# Patient Record
Sex: Female | Born: 1995 | Race: Black or African American | Hispanic: No | Marital: Single | Smoking: Current every day smoker
Health system: Southern US, Community
[De-identification: ages and names within clinical notes are randomized; demographics above are authoritative.]

## PROBLEM LIST (undated history)

## (undated) DIAGNOSIS — I739 Peripheral vascular disease, unspecified: Secondary | ICD-10-CM

## (undated) DIAGNOSIS — T7840XA Allergy, unspecified, initial encounter: Secondary | ICD-10-CM

## (undated) DIAGNOSIS — L309 Dermatitis, unspecified: Secondary | ICD-10-CM

## (undated) DIAGNOSIS — J45909 Unspecified asthma, uncomplicated: Secondary | ICD-10-CM

## (undated) HISTORY — DX: Allergy, unspecified, initial encounter: T78.40XA

## (undated) HISTORY — PX: HERNIA REPAIR: SHX51

## (undated) HISTORY — DX: Peripheral vascular disease, unspecified: I73.9

---
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
## 2015-07-15 ENCOUNTER — Emergency Department (HOSPITAL_BASED_OUTPATIENT_CLINIC_OR_DEPARTMENT_OTHER): Payer: BLUE CROSS/BLUE SHIELD

## 2015-07-15 ENCOUNTER — Emergency Department (HOSPITAL_BASED_OUTPATIENT_CLINIC_OR_DEPARTMENT_OTHER)
Admission: EM | Admit: 2015-07-15 | Discharge: 2015-07-15 | Disposition: A | Discharge status: Home / Self Care | Payer: BLUE CROSS/BLUE SHIELD | Attending: Emergency Medicine | Admitting: Emergency Medicine

## 2015-07-15 ENCOUNTER — Encounter (HOSPITAL_BASED_OUTPATIENT_CLINIC_OR_DEPARTMENT_OTHER): Payer: Self-pay | Admitting: *Deleted

## 2015-07-15 DIAGNOSIS — Z791 Long term (current) use of non-steroidal anti-inflammatories (NSAID): Secondary | ICD-10-CM | POA: Diagnosis not present

## 2015-07-15 DIAGNOSIS — Z792 Long term (current) use of antibiotics: Secondary | ICD-10-CM | POA: Diagnosis not present

## 2015-07-15 DIAGNOSIS — M25511 Pain in right shoulder: Secondary | ICD-10-CM | POA: Insufficient documentation

## 2015-07-15 DIAGNOSIS — Z79899 Other long term (current) drug therapy: Secondary | ICD-10-CM | POA: Diagnosis not present

## 2015-07-15 DIAGNOSIS — J45909 Unspecified asthma, uncomplicated: Secondary | ICD-10-CM | POA: Diagnosis not present

## 2015-07-15 DIAGNOSIS — F17200 Nicotine dependence, unspecified, uncomplicated: Secondary | ICD-10-CM | POA: Insufficient documentation

## 2015-07-15 DIAGNOSIS — E663 Overweight: Secondary | ICD-10-CM | POA: Insufficient documentation

## 2015-07-15 MED ORDER — KETOROLAC TROMETHAMINE 30 MG/ML IJ SOLN
30.0000 mg | Freq: Once | INTRAMUSCULAR | Status: AC
  Administered 2015-07-15: 30 mg via INTRAMUSCULAR
  Filled 2015-07-15: qty 1

## 2015-07-15 MED ORDER — IBUPROFEN 600 MG PO TABS: 600.0000 mg | ORAL_TABLET | Freq: Four times a day (QID) | ORAL | Status: AC | PRN

## 2015-07-15 NOTE — ED Notes (Signed)
DC instructions reviewed with pt along with Rx from EDP as well, discussed ice and rest of rt shoulder to aid in pain control. Opportunity for questions provided

## 2015-07-15 NOTE — ED Notes (Addendum)
C/o R shoulder pain, constant with movement, onset 2d ago, no radiation, (denies: neck, back, arm or hand pain, nvd, fever, sob, cough or other sx), pt is R hand dominant, works doing heavy lifting packing road signs, no h/o same, rates 7/10, took ASA 2d ago, no meds PTA, pt of Dr. Lamona Curlarlos Smith.

## 2015-07-15 NOTE — Discharge Instructions (Signed)
You were seen today for shoulder pain.  This may be related to an overuse injury. Ibuprofen every 6 hours as needed for pain. Follow-up with sports medicine if symptoms do not improve. Use rest and ice.  Shoulder Pain The shoulder is the joint that connects your arms to your body. The bones that form the shoulder joint include the upper arm bone (humerus), the shoulder blade (scapula), and the collarbone (clavicle). The top of the humerus is shaped like a ball and fits into a rather flat socket on the scapula (glenoid cavity). A combination of muscles and strong, fibrous tissues that connect muscles to bones (tendons) support your shoulder joint and hold the ball in the socket. Small, fluid-filled sacs (bursae) are located in different areas of the joint. They act as cushions between the bones and the overlying soft tissues and help reduce friction between the gliding tendons and the bone as you move your arm. Your shoulder joint allows a wide range of motion in your arm. This range of motion allows you to do things like scratch your back or throw a ball. However, this range of motion also makes your shoulder more prone to pain from overuse and injury. Causes of shoulder pain can originate from both injury and overuse and usually can be grouped in the following four categories:  Redness, swelling, and pain (inflammation) of the tendon (tendinitis) or the bursae (bursitis).  Instability, such as a dislocation of the joint.  Inflammation of the joint (arthritis).  Broken bone (fracture). HOME CARE INSTRUCTIONS   Apply ice to the sore area.  Put ice in a plastic bag.  Place a towel between your skin and the bag.  Leave the ice on for 15-20 minutes, 3-4 times per day for the first 2 days, or as directed by your health care provider.  Stop using cold packs if they do not help with the pain.  If you have a shoulder sling or immobilizer, wear it as long as your caregiver instructs. Only remove it  to shower or bathe. Move your arm as little as possible, but keep your hand moving to prevent swelling.  Squeeze a soft ball or foam pad as much as possible to help prevent swelling.  Only take over-the-counter or prescription medicines for pain, discomfort, or fever as directed by your caregiver. SEEK MEDICAL CARE IF:   Your shoulder pain increases, or new pain develops in your arm, hand, or fingers.  Your hand or fingers become cold and numb.  Your pain is not relieved with medicines. SEEK IMMEDIATE MEDICAL CARE IF:   Your arm, hand, or fingers are numb or tingling.  Your arm, hand, or fingers are significantly swollen or turn white or blue. MAKE SURE YOU:   Understand these instructions.  Will watch your condition.  Will get help right away if you are not doing well or get worse.   This information is not intended to replace advice given to you by your health care provider. Make sure you discuss any questions you have with your health care provider.   Document Released: 01/29/2005 Document Revised: 05/12/2014 Document Reviewed: 08/14/2014 Elsevier Interactive Patient Education [employer]2016 Elsevier Inc.

## 2015-07-15 NOTE — ED Provider Notes (Signed)
CSN: [id number]     Arrival date & time 07/15/15  0617 History   First MD Initiated Contact with Patient 07/15/15 [phone]     Chief Complaint  Patient presents with  . Shoulder Pain     (Consider location/radiation/quality/duration/timing/severity/associated sxs/prior Treatment) HPI  This is a 20 year old female who presents with right shoulder pain. Patient reports pain for the last 2 days. Constant. Worse with motion and pushing or pulling. She is right-hand dominant. She reports that she does a lot of heavy lifting at work. She took an aspirin with minimal relief 2 days ago. Denies any injury. Denies any weakness, numbness, tingling of the hand. Denies any neck pain.  Past Medical History  Diagnosis Date  . Asthma   . Allergy    Past Surgical History  Procedure Laterality Date  . Hernia repair     Family History  Problem Relation Age of Onset  . Hyperlipidemia Mother   . Hypertension Mother   . Sudden death Neg Hx   . Heart attack Neg Hx   . Diabetes Neg Hx    Social History  Substance Use Topics  . Smoking status: Current Every Day Smoker  . Smokeless tobacco: None  . Alcohol Use: No   OB History    No data available     Review of Systems  Musculoskeletal:       Right shoulder pain  Neurological: Negative for weakness.  All other systems reviewed and are negative.     Allergies  Review of patient's allergies indicates no known allergies.  Home Medications   Prior to Admission medications   Medication Sig Start Date End Date Taking? Authorizing Provider  albuterol (PROVENTIL HFA;VENTOLIN HFA) 108 (90 BASE) MCG/ACT inhaler Inhale 2 puffs into the lungs every 6 (six) hours as needed for wheezing.    Historical Provider, MD  cephALEXin (KEFLEX) 500 MG capsule Take 1 capsule (500 mg total) by mouth 4 (four) times daily. 05/26/13   April Palumbo, MD  cyclobenzaprine (FLEXERIL) 10 MG tablet Take 1 tablet (10 mg total) by mouth 3 (three) times daily as needed for  muscle spasms. 02/15/13   John Molpus, MD  doxycycline (VIBRAMYCIN) 100 MG capsule Take 1 capsule (100 mg total) by mouth 2 (two) times daily. One po bid x 7 days 05/26/13   April Palumbo, MD  EPIPEN 2-PAK 0.3 MG/0.3ML SOAJ injection  02/01/13   Historical Provider, MD  HYDROcodone-acetaminophen (NORCO/VICODIN) 5-325 MG per tablet Take 1-2 tablets by mouth every 6 (six) hours as needed for pain. 02/15/13   John Molpus, MD  ibuprofen (ADVIL,MOTRIN) 600 MG tablet Take 1 tablet (600 mg total) by mouth every 6 (six) hours as needed. 07/15/15   Shon Baton, MD  naproxen sodium (ANAPROX) 220 MG tablet Take 220 mg by mouth 2 (two) times daily with a meal.    Historical Provider, MD  traMADol (ULTRAM) 50 MG tablet Take 1 tablet (50 mg total) by mouth every 6 (six) hours as needed. 05/26/13   April Palumbo, MD   BP 117/69 mmHg  Pulse 86  Temp(Src) 98.3 F (36.8 C) (Oral)  Resp 16  Ht  (1.6 m)  Wt 185 lb (83.915 kg)  BMI 32.78 kg/m2  SpO2 99%  LMP 06/08/2015 (Exact Date) Physical Exam  Constitutional: She is oriented to person, place, and time. She appears well-developed and well-nourished.  Overweight  HENT:  Head: Normocephalic and atraumatic.  Cardiovascular: Normal rate and regular rhythm.   Pulmonary/Chest: Effort normal. No  respiratory distress.  Musculoskeletal:  Normal range of motion, increased pain with abduction, 5 out of 5 strength with grip, biceps, triceps, no tenderness to palpation or deformity noted over the clavicle or AC joint  Neurological: She is alert and oriented to person, place, and time.  Skin: Skin is warm and dry.  Psychiatric: She has a normal mood and affect.  Nursing note and vitals reviewed.   ED Course  Procedures (including critical care time) Labs Review Labs Reviewed - No data to display  Imaging Review Dg Shoulder Right  07/15/2015  CLINICAL DATA:  Right shoulder pain. EXAM: RIGHT SHOULDER - 2+ VIEW COMPARISON:  Two-view chest x-ray 02/22/2008  FINDINGS: There is no evidence of fracture or dislocation. There is no evidence of arthropathy or other focal bone abnormality. Soft tissues are unremarkable. IMPRESSION: Negative right shoulder radiographs. Electronically Signed   By: Marin Robertshristopher  Mattern M.D.   On: 07/15/2015 07:22   I have personally reviewed and evaluated these images and lab results as part of my medical decision-making.   EKG Interpretation None      MDM   Final diagnoses:  Right shoulder pain    Patient presents with right shoulder pain. Musculoskeletal in nature.  Ibuprofen and ice and rest for pain management.  After history, exam, and medical workup I feel the patient has been appropriately medically screened and is safe for discharge home. Pertinent diagnoses were discussed with the patient. Patient was given return precautions.     Shon Batonourtney F Horton, MD 07/16/15 [phone]

## 2015-08-21 ENCOUNTER — Encounter (HOSPITAL_BASED_OUTPATIENT_CLINIC_OR_DEPARTMENT_OTHER): Payer: Self-pay

## 2015-08-21 ENCOUNTER — Emergency Department (HOSPITAL_BASED_OUTPATIENT_CLINIC_OR_DEPARTMENT_OTHER)
Admission: EM | Admit: 2015-08-21 | Discharge: 2015-08-21 | Disposition: A | Discharge status: Home / Self Care | Payer: BLUE CROSS/BLUE SHIELD | Attending: Emergency Medicine | Admitting: Emergency Medicine

## 2015-08-21 DIAGNOSIS — J45909 Unspecified asthma, uncomplicated: Secondary | ICD-10-CM | POA: Insufficient documentation

## 2015-08-21 DIAGNOSIS — R0602 Shortness of breath: Secondary | ICD-10-CM | POA: Insufficient documentation

## 2015-08-21 DIAGNOSIS — F17200 Nicotine dependence, unspecified, uncomplicated: Secondary | ICD-10-CM | POA: Diagnosis not present

## 2015-08-21 DIAGNOSIS — Z79899 Other long term (current) drug therapy: Secondary | ICD-10-CM | POA: Diagnosis not present

## 2015-08-21 DIAGNOSIS — J45901 Unspecified asthma with (acute) exacerbation: Secondary | ICD-10-CM

## 2015-08-21 HISTORY — DX: Dermatitis, unspecified: L30.9

## 2015-08-21 MED ORDER — ALBUTEROL SULFATE (2.5 MG/3ML) 0.083% IN NEBU
5.0000 mg | INHALATION_SOLUTION | Freq: Once | RESPIRATORY_TRACT | Status: AC
  Administered 2015-08-21: 5 mg via RESPIRATORY_TRACT
  Filled 2015-08-21: qty 6

## 2015-08-21 MED ORDER — DEXAMETHASONE SODIUM PHOSPHATE 10 MG/ML IJ SOLN
10.0000 mg | Freq: Once | INTRAMUSCULAR | Status: AC
  Administered 2015-08-21: 10 mg via INTRAMUSCULAR
  Filled 2015-08-21: qty 1

## 2015-08-21 MED ORDER — DEXAMETHASONE 2 MG PO TABS: ORAL_TABLET | ORAL | Status: DC

## 2015-08-21 MED ORDER — ALBUTEROL SULFATE (2.5 MG/3ML) 0.083% IN NEBU
2.5000 mg | INHALATION_SOLUTION | Freq: Once | RESPIRATORY_TRACT | Status: AC
  Administered 2015-08-21: 2.5 mg via RESPIRATORY_TRACT
  Filled 2015-08-21: qty 3

## 2015-08-21 MED ORDER — IPRATROPIUM-ALBUTEROL 0.5-2.5 (3) MG/3ML IN SOLN
3.0000 mL | Freq: Once | RESPIRATORY_TRACT | Status: AC
  Administered 2015-08-21: 3 mL via RESPIRATORY_TRACT
  Filled 2015-08-21: qty 3

## 2015-08-21 MED ORDER — ALBUTEROL SULFATE HFA 108 (90 BASE) MCG/ACT IN AERS
2.0000 | INHALATION_SPRAY | RESPIRATORY_TRACT | Status: DC | PRN
  Administered 2015-08-21: 2 via RESPIRATORY_TRACT
  Filled 2015-08-21: qty 6.7

## 2015-08-21 NOTE — ED Provider Notes (Addendum)
CSN: [id number]     Arrival date & time 08/21/15  4098 History   First MD Initiated Contact with Patient 08/21/15 [phone]     Chief Complaint  Patient presents with  . Shortness of Breath     (Consider location/radiation/quality/duration/timing/severity/associated sxs/prior Treatment) HPI This is a 20 year old female with a history of asthma and seasonal allergies. She is here with shortness of breath began last night and is worsened overnight. It is now moderate to severe. Symptoms are consistent with previous asthma attacks. She has used her inhaler 3 times without adequate relief. She states her inhaler is expired and may not be working. She denies fever. She has had a persistent dry cough.  Past Medical History  Diagnosis Date  . Asthma   . Allergy   . Eczema    Past Surgical History  Procedure Laterality Date  . Hernia repair     Family History  Problem Relation Age of Onset  . Hyperlipidemia Mother   . Hypertension Mother   . Sudden death Neg Hx   . Heart attack Neg Hx   . Diabetes Neg Hx    Social History  Substance Use Topics  . Smoking status: Current Every Day Smoker  . Smokeless tobacco: None  . Alcohol Use: No   OB History    No data available     Review of Systems  All other systems reviewed and are negative.   Allergies  Apple; Carrot; Eggs or egg-derived products; and Pollen extract  Home Medications   Prior to Admission medications   Medication Sig Start Date End Date Taking? Authorizing Provider  albuterol (PROVENTIL HFA;VENTOLIN HFA) 108 (90 BASE) MCG/ACT inhaler Inhale 2 puffs into the lungs every 6 (six) hours as needed for wheezing.   Yes Historical Provider, MD  EPIPEN 2-PAK 0.3 MG/0.3ML SOAJ injection  02/01/13  Yes Historical Provider, MD  cephALEXin (KEFLEX) 500 MG capsule Take 1 capsule (500 mg total) by mouth 4 (four) times daily. 05/26/13   April Palumbo, MD  cyclobenzaprine (FLEXERIL) 10 MG tablet Take 1 tablet (10 mg total) by mouth 3  (three) times daily as needed for muscle spasms. 02/15/13   Jeret Goyer, MD  doxycycline (VIBRAMYCIN) 100 MG capsule Take 1 capsule (100 mg total) by mouth 2 (two) times daily. One po bid x 7 days 05/26/13   April Palumbo, MD  HYDROcodone-acetaminophen (NORCO/VICODIN) 5-325 MG per tablet Take 1-2 tablets by mouth every 6 (six) hours as needed for pain. 02/15/13   Yu Peggs, MD  ibuprofen (ADVIL,MOTRIN) 600 MG tablet Take 1 tablet (600 mg total) by mouth every 6 (six) hours as needed. 07/15/15   Shon Baton, MD  naproxen sodium (ANAPROX) 220 MG tablet Take 220 mg by mouth 2 (two) times daily with a meal.    Historical Provider, MD  traMADol (ULTRAM) 50 MG tablet Take 1 tablet (50 mg total) by mouth every 6 (six) hours as needed. 05/26/13   April Palumbo, MD   BP 137/83 mmHg  Pulse 117  Temp(Src) 98.5 F (36.9 C) (Oral)  Resp 22  Ht  (1.6 m)  Wt 180 lb (81.647 kg)  BMI 31.89 kg/m2  SpO2 93%  LMP 07/24/2015   Physical Exam  General: Well-developed, well-nourished female; appearance consistent with age of record HENT: normocephalic; atraumatic Eyes: pupils equal, round and reactive to light; extraocular muscles intact Neck: supple Heart: regular rate and rhythm; tachycardia Lungs: Mild tachypnea; decreased air movement throughout; frequent coughing Abdomen: soft; nondistended; nontender; bowel  sounds present Extremities: No deformity; full range of motion; pulses normal Neurologic: Awake, alert and oriented; motor function intact in all extremities and symmetric; no facial droop Skin: Warm and dry Psychiatric: Normal mood and affect    ED Course  Procedures (including critical care time)   MDM  6:51 AM Air movement significantly improved after albuterol and Atrovent neb treatment. Dexamethasone 10 milligrams IM given.     Paula LibraJohn Elenna Spratling, MD 08/21/15 [id number]  Paula LibraJohn Oaklynn Stierwalt, MD 08/21/15 [phone]

## 2015-08-21 NOTE — ED Notes (Signed)
Pt appears to be doing better, verbalizes she feels better.  Breathing has slowed to a normal rate.

## 2015-08-21 NOTE — Discharge Instructions (Signed)
Asthma, Adult Asthma is a recurring condition in which the airways tighten and narrow. Asthma can make it difficult to breathe. It can cause coughing, wheezing, and shortness of breath. Asthma episodes, also called asthma attacks, range from minor to life-threatening. Asthma cannot be cured, but medicines and lifestyle changes can help control it. CAUSES Asthma is believed to be caused by inherited (genetic) and environmental factors, but its exact cause is unknown. Asthma may be triggered by allergens, lung infections, or irritants in the air. Asthma triggers are different for each person. Common triggers include:   Animal dander.  Dust mites.  Cockroaches.  Pollen from trees or grass.  Mold.  Smoke.  Air pollutants such as dust, household cleaners, hair sprays, aerosol sprays, paint fumes, strong chemicals, or strong odors.  Cold air, weather changes, and winds (which increase molds and pollens in the air).  Strong emotional expressions such as crying or laughing hard.  Stress.  Certain medicines (such as aspirin) or types of drugs (such as beta-blockers).  Sulfites in foods and drinks. Foods and drinks that may contain sulfites include dried fruit, potato chips, and sparkling grape juice.  Infections or inflammatory conditions such as the flu, a cold, or an inflammation of the nasal membranes (rhinitis).  Gastroesophageal reflux disease (GERD).  Exercise or strenuous activity. SYMPTOMS Symptoms may occur immediately after asthma is triggered or many hours later. Symptoms include:  Wheezing.  Excessive nighttime or early morning coughing.  Frequent or severe coughing with a common cold.  Chest tightness.  Shortness of breath. DIAGNOSIS  The diagnosis of asthma is made by a review of your medical history and a physical exam. Tests may also be performed. These may include:  Lung function studies. These tests show how much air you breathe in and out.  Allergy  tests.  Imaging tests such as X-rays. TREATMENT  Asthma cannot be cured, but it can usually be controlled. Treatment involves identifying and avoiding your asthma triggers. It also involves medicines. There are 2 classes of medicine used for asthma treatment:   Controller medicines. These prevent asthma symptoms from occurring. They are usually taken every day.  Reliever or rescue medicines. These quickly relieve asthma symptoms. They are used as needed and provide short-term relief. Your health care provider will help you create an asthma action plan. An asthma action plan is a written plan for managing and treating your asthma attacks. It includes a list of your asthma triggers and how they may be avoided. It also includes information on when medicines should be taken and when their dosage should be changed. An action plan may also involve the use of a device called a peak flow meter. A peak flow meter measures how well the lungs are working. It helps you monitor your condition. HOME CARE INSTRUCTIONS   Take medicines only as directed by your health care provider. Speak with your health care provider if you have questions about how or when to take the medicines.  Use a peak flow meter as directed by your health care provider. Record and keep track of readings.  Understand and use the action plan to help minimize or stop an asthma attack without needing to seek medical care.  Control your home environment in the following ways to help prevent asthma attacks:  Do not smoke. Avoid being exposed to secondhand smoke.  Change your heating and air conditioning filter regularly.  Limit your use of fireplaces and wood stoves.  Get rid of pests (such as roaches   and mice) and their droppings.  Throw away plants if you see mold on them.  Clean your floors and dust regularly. Use unscented cleaning products.  Try to have someone else vacuum for you regularly. Stay out of rooms while they are  being vacuumed and for a short while afterward. If you vacuum, use a dust mask from a hardware store, a double-layered or microfilter vacuum cleaner bag, or a vacuum cleaner with a HEPA filter.  Replace carpet with wood, tile, or vinyl flooring. Carpet can trap dander and dust.  Use allergy-proof pillows, mattress covers, and box spring covers.  Wash bed sheets and blankets every week in hot water and dry them in a dryer.  Use blankets that are made of polyester or cotton.  Clean bathrooms and kitchens with bleach. If possible, have someone repaint the walls in these rooms with mold-resistant paint. Keep out of the rooms that are being cleaned and painted.  Wash hands frequently. SEEK MEDICAL CARE IF:   You have wheezing, shortness of breath, or a cough even if taking medicine to prevent attacks.  The colored mucus you cough up (sputum) is thicker than usual.  Your sputum changes from clear or white to yellow, green, gray, or bloody.  You have any problems that may be related to the medicines you are taking (such as a rash, itching, swelling, or trouble breathing).  You are using a reliever medicine more than 2-3 times per week.  Your peak flow is still at 50-79% of your personal best after following your action plan for 1 hour.  You have a fever. SEEK IMMEDIATE MEDICAL CARE IF:   You seem to be getting worse and are unresponsive to treatment during an asthma attack.  You are short of breath even at rest.  You get short of breath when doing very little physical activity.  You have difficulty eating, drinking, or talking due to asthma symptoms.  You develop chest pain.  You develop a fast heartbeat.  You have a bluish color to your lips or fingernails.  You are light-headed, dizzy, or faint.  Your peak flow is less than 50% of your personal best.   This information is not intended to replace advice given to you by your health care provider. Make sure you discuss any  questions you have with your health care provider.   Document Released: 04/21/2005 Document Revised: 01/10/2015 Document Reviewed: 11/18/2012 Elsevier Interactive Patient Education 2016 [employer] Inc[employer]  

## 2015-08-21 NOTE — ED Notes (Signed)
Pt c/o increased SOB with nagging cough since last night.  She used her inhaler three times without relief, and it was expired.

## 2015-08-21 NOTE — ED Notes (Signed)
Pt verbalizes understanding of d/c instructions and denies any further needs at this time. 

## 2017-03-08 ENCOUNTER — Emergency Department (HOSPITAL_BASED_OUTPATIENT_CLINIC_OR_DEPARTMENT_OTHER)
Admission: EM | Admit: 2017-03-08 | Discharge: 2017-03-08 | Disposition: A | Discharge status: Home / Self Care | Payer: No Typology Code available for payment source | Attending: Emergency Medicine | Admitting: Emergency Medicine

## 2017-03-08 ENCOUNTER — Other Ambulatory Visit: Payer: Self-pay

## 2017-03-08 ENCOUNTER — Emergency Department (HOSPITAL_BASED_OUTPATIENT_CLINIC_OR_DEPARTMENT_OTHER): Payer: No Typology Code available for payment source

## 2017-03-08 ENCOUNTER — Encounter (HOSPITAL_BASED_OUTPATIENT_CLINIC_OR_DEPARTMENT_OTHER): Payer: Self-pay | Admitting: *Deleted

## 2017-03-08 DIAGNOSIS — Y939 Activity, unspecified: Secondary | ICD-10-CM | POA: Diagnosis not present

## 2017-03-08 DIAGNOSIS — Y999 Unspecified external cause status: Secondary | ICD-10-CM | POA: Insufficient documentation

## 2017-03-08 DIAGNOSIS — Z79899 Other long term (current) drug therapy: Secondary | ICD-10-CM | POA: Diagnosis not present

## 2017-03-08 DIAGNOSIS — J45909 Unspecified asthma, uncomplicated: Secondary | ICD-10-CM | POA: Insufficient documentation

## 2017-03-08 DIAGNOSIS — Y929 Unspecified place or not applicable: Secondary | ICD-10-CM | POA: Insufficient documentation

## 2017-03-08 DIAGNOSIS — R51 Headache: Secondary | ICD-10-CM | POA: Insufficient documentation

## 2017-03-08 DIAGNOSIS — S0990XA Unspecified injury of head, initial encounter: Secondary | ICD-10-CM

## 2017-03-08 MED ORDER — NAPROXEN 500 MG PO TABS: 500.0000 mg | ORAL_TABLET | Freq: Two times a day (BID) | ORAL | 0 refills | Status: DC
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
## 2017-03-08 NOTE — ED Notes (Signed)
Pt discharged to home with family. NAD.  
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
## 2019-09-25 ENCOUNTER — Emergency Department (HOSPITAL_BASED_OUTPATIENT_CLINIC_OR_DEPARTMENT_OTHER): Payer: BC Managed Care – PPO

## 2019-09-25 ENCOUNTER — Other Ambulatory Visit: Payer: Self-pay

## 2019-09-25 ENCOUNTER — Emergency Department (HOSPITAL_BASED_OUTPATIENT_CLINIC_OR_DEPARTMENT_OTHER)
Admission: EM | Admit: 2019-09-25 | Discharge: 2019-09-25 | Disposition: A | Discharge status: Home / Self Care | Payer: BC Managed Care – PPO | Attending: Emergency Medicine | Admitting: Emergency Medicine

## 2019-09-25 ENCOUNTER — Encounter (HOSPITAL_BASED_OUTPATIENT_CLINIC_OR_DEPARTMENT_OTHER): Payer: Self-pay | Admitting: Emergency Medicine

## 2019-09-25 DIAGNOSIS — J45909 Unspecified asthma, uncomplicated: Secondary | ICD-10-CM | POA: Diagnosis not present

## 2019-09-25 DIAGNOSIS — X58XXXA Exposure to other specified factors, initial encounter: Secondary | ICD-10-CM | POA: Diagnosis not present

## 2019-09-25 DIAGNOSIS — Y9341 Activity, dancing: Secondary | ICD-10-CM | POA: Insufficient documentation

## 2019-09-25 DIAGNOSIS — F17210 Nicotine dependence, cigarettes, uncomplicated: Secondary | ICD-10-CM | POA: Diagnosis not present

## 2019-09-25 DIAGNOSIS — Y9289 Other specified places as the place of occurrence of the external cause: Secondary | ICD-10-CM | POA: Insufficient documentation

## 2019-09-25 DIAGNOSIS — S8992XA Unspecified injury of left lower leg, initial encounter: Secondary | ICD-10-CM | POA: Diagnosis not present

## 2019-09-25 DIAGNOSIS — Y998 Other external cause status: Secondary | ICD-10-CM | POA: Diagnosis not present

## 2019-09-25 MED ORDER — IBUPROFEN 400 MG PO TABS
600.0000 mg | ORAL_TABLET | Freq: Once | ORAL | Status: AC
  Administered 2019-09-25: 600 mg via ORAL
  Filled 2019-09-25: qty 1

## 2019-09-25 NOTE — ED Triage Notes (Signed)
L knee pain since Saturday. No known new injury. Pt states old injury (2015) that does flare up occasionally but "this feels different". Pt states whole knee hurts and back of knee "feels tight and I can feel convulsions". Pt is ambulatory at triage, wearing personal knee support. Tachy at triage. Pt states she is nervous.

## 2019-09-25 NOTE — Discharge Instructions (Signed)
Your xray is normal. Apply ice to your knee for 20 minutes at a time. You can take ibuprofen every 6 hours as needed for pain. Schedule an appointment with the sports medicine specialist in 1-2 weeks for follow-up on your injury. Return to the ER for new or concerning symptoms.
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
## 2020-08-14 ENCOUNTER — Emergency Department (HOSPITAL_BASED_OUTPATIENT_CLINIC_OR_DEPARTMENT_OTHER): Payer: BC Managed Care – PPO

## 2020-08-14 ENCOUNTER — Emergency Department (HOSPITAL_BASED_OUTPATIENT_CLINIC_OR_DEPARTMENT_OTHER)
Admission: EM | Admit: 2020-08-14 | Discharge: 2020-08-14 | Disposition: A | Discharge status: Home / Self Care | Payer: BC Managed Care – PPO | Attending: Emergency Medicine | Admitting: Emergency Medicine

## 2020-08-14 ENCOUNTER — Encounter (HOSPITAL_BASED_OUTPATIENT_CLINIC_OR_DEPARTMENT_OTHER): Payer: Self-pay | Admitting: *Deleted

## 2020-08-14 ENCOUNTER — Other Ambulatory Visit: Payer: Self-pay

## 2020-08-14 DIAGNOSIS — R000 Tachycardia, unspecified: Secondary | ICD-10-CM | POA: Diagnosis not present

## 2020-08-14 DIAGNOSIS — J45909 Unspecified asthma, uncomplicated: Secondary | ICD-10-CM | POA: Insufficient documentation

## 2020-08-14 DIAGNOSIS — J069 Acute upper respiratory infection, unspecified: Secondary | ICD-10-CM

## 2020-08-14 DIAGNOSIS — R0602 Shortness of breath: Secondary | ICD-10-CM | POA: Diagnosis present

## 2020-08-14 DIAGNOSIS — Z20822 Contact with and (suspected) exposure to covid-19: Secondary | ICD-10-CM | POA: Diagnosis not present

## 2020-08-14 DIAGNOSIS — Z28311 Partially vaccinated for covid-19: Secondary | ICD-10-CM | POA: Diagnosis not present

## 2020-08-14 LAB — PREGNANCY, URINE: Preg Test, Ur: NEGATIVE

## 2020-08-14 LAB — RAPID INFLUENZA A&B ANTIGENS
Influenza A (ARMC): NEGATIVE
Influenza B (ARMC): NEGATIVE

## 2020-08-14 MED ORDER — METHYLPREDNISOLONE SODIUM SUCC 125 MG IJ SOLR
80.0000 mg | Freq: Once | INTRAMUSCULAR | Status: AC
  Administered 2020-08-14: 80 mg via INTRAVENOUS
  Filled 2020-08-14: qty 2

## 2020-08-14 MED ORDER — ACETAMINOPHEN 325 MG PO TABS
650.0000 mg | ORAL_TABLET | Freq: Once | ORAL | Status: AC
  Administered 2020-08-14: 650 mg via ORAL
  Filled 2020-08-14: qty 2

## 2020-08-14 MED ORDER — BENZONATATE 100 MG PO CAPS
100.0000 mg | ORAL_CAPSULE | Freq: Once | ORAL | Status: AC
  Administered 2020-08-14: 100 mg via ORAL
  Filled 2020-08-14: qty 1

## 2020-08-14 MED ORDER — BENZONATATE 100 MG PO CAPS
100.0000 mg | ORAL_CAPSULE | Freq: Three times a day (TID) | ORAL | 0 refills | Status: AC
Start: 2020-08-14 — End: ?

## 2020-08-14 MED ORDER — SODIUM CHLORIDE 0.9 % IV BOLUS
1000.0000 mL | Freq: Once | INTRAVENOUS | Status: AC
  Administered 2020-08-14: 1000 mL via INTRAVENOUS

## 2020-08-14 MED ORDER — IBUPROFEN 800 MG PO TABS
800.0000 mg | ORAL_TABLET | Freq: Once | ORAL | Status: AC
  Administered 2020-08-14: 800 mg via ORAL
  Filled 2020-08-14: qty 1

## 2020-08-14 MED ORDER — ALBUTEROL SULFATE HFA 108 (90 BASE) MCG/ACT IN AERS
6.0000 | INHALATION_SPRAY | Freq: Once | RESPIRATORY_TRACT | Status: AC
  Administered 2020-08-14: 6 via RESPIRATORY_TRACT
  Filled 2020-08-14: qty 6.7

## 2020-08-14 NOTE — ED Notes (Signed)
Report to Rebecca, RN.

## 2020-08-14 NOTE — ED Notes (Signed)
Pt ambulated in halls without shortness of breath. Sats maintained above 95% on room air.

## 2020-08-14 NOTE — ED Notes (Signed)
RT assessed patient at triage. Stated she has been SOB, has not needed MDI in a long time. No wheezing noted at this time, but she does have a productive cough and a fever. Will re-assess as needed

## 2020-08-14 NOTE — ED Triage Notes (Signed)
C/o SOb , cough  X 1 day , hx asthma

## 2020-08-14 NOTE — Discharge Instructions (Signed)
Your history and physical exam is suggestive of a viral illness.  You have been tested for COVID-19.    Please maintain isolation precautions.  Check your temperature regularly and take Tylenol as needed for fever control.  Increase your oral hydration and continue to eat regular meals to avoid electrolyte derangement and further fatigue.  I recommend over-the-counter medications as needed for symptom relief.  I have also prescribed you Tessalon Perles which can take as needed for cough symptoms.  Continue to use albuterol with spacer as needed for shortness of breath or wheezing.  Follow-up with your primary care provider regarding today's encounter and for ongoing management.   Return to the ED or seek immediate medical attention should you experience any new or worsening symptoms.

## 2020-08-14 NOTE — ED Provider Notes (Signed)
[facility] EMERGENCY DEPARTMENT Provider Note   CSN: [id number] Arrival date & time: 08/14/20  1356     History Chief Complaint  Patient presents with  . Shortness of Breath    Felicia Castro is a 25 y.o. female with past medical history of asthma who presents the ED with complaints of shortness of breath and cough.  On my examination, patient reports that yesterday at 6 PM she developed mild shortness of breath symptoms.  She states that she feels this is a flareup of her seasonal allergy related asthma.  She states that her last exacerbation was a couple of years ago and required albuterol treatments.  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  She states that she has been having mild rhinorrhea and cough productive of clearish sputum.  She states that she noticed that she is becoming increasingly wheezy, particularly when coughing.  She denies any fevers, chills, obvious sick contacts, chest pain, abdominal pain, nausea or vomiting, changes in her bowel habits, unilateral extremity swelling or edema, history of clots or clotting disorder, hemoptysis, pleuritic symptoms, or other symptoms.  HPI     Past Medical History:  Diagnosis Date  . Allergy   . Asthma   . Eczema     Patient Active Problem List   Diagnosis Date Noted  . Left knee injury 03/17/2013    Past Surgical History:  Procedure Laterality Date  . HERNIA REPAIR       OB History   No obstetric history on file.     Family History  Problem Relation Age of Onset  . Hyperlipidemia Mother   . Hypertension Mother   . Sudden death Neg Hx   . Heart attack Neg Hx   . Diabetes Neg Hx     Social History   Tobacco Use  . Smoking status: Never Smoker  . Smokeless tobacco: Never Used  Vaping Use  . Vaping Use: Some days  Substance Use Topics  . Alcohol use: Yes    Comment: social  . Drug use: Yes    Types: Marijuana    Home Medications Prior  to Admission medications   Medication Sig Start Date End Date Taking? Authorizing Provider  benzonatate (TESSALON) 100 MG capsule Take 1 capsule (100 mg total) by mouth every 8 (eight) hours. 08/14/20  Yes Lorelee NewGreen, Nile Dorning L, PA-C  albuterol (PROVENTIL HFA;VENTOLIN HFA) 108 (90 BASE) MCG/ACT inhaler Inhale 2 puffs into the lungs every 6 (six) hours as needed for wheezing.    [provider]  EPIPEN 2-PAK 0.3 MG/0.3ML SOAJ injection  02/01/13   [provider]  ibuprofen (ADVIL,MOTRIN) 600 MG tablet Take 1 tablet (600 mg total) by mouth every 6 (six) hours as needed. 07/15/15   Horton, Mayer Maskerourtney F, MD  naproxen (NAPROSYN) 500 MG tablet Take 1 tablet (500 mg total) 2 (two) times daily by mouth. 03/08/17   Vanetta MuldersZackowski, Scott, MD  naproxen sodium (ANAPROX) 220 MG tablet Take 220 mg by mouth 2 (two) times daily with a meal.    [provider]    Allergies    Apple, Carrot [daucus carota], Eggs or egg-derived products, and Pollen extract  Review of Systems   Review of Systems  All other systems reviewed and are negative.   Physical Exam Updated Vital Signs BP 110/70 (BP Location: Left Arm)   Pulse (!) 128   Temp 100.3 F (37.9 C) (Oral)   Resp 18  Ht 5\' 3"  (1.6 m)   Wt 95.3 kg   LMP 08/05/2020   SpO2 96%   BMI 37.20 kg/m   Physical Exam Vitals and nursing note reviewed. Exam conducted with a chaperone present.  Constitutional:      General: She is not in acute distress.    Appearance: She is not toxic-appearing.  HENT:     Head: Normocephalic and atraumatic.  Eyes:     General: No scleral icterus.    Conjunctiva/sclera: Conjunctivae normal.  Cardiovascular:     Rate and Rhythm: Regular rhythm. Tachycardia present.     Pulses: Normal pulses.  Pulmonary:     Effort: Pulmonary effort is normal. No respiratory distress.     Breath sounds: Wheezing present.     Comments: No increased work of breathing.  Speaks in full sentences.  Audible wheezing, worse with  cough.  On auscultation, very mild wheezing noted.  Breath sounds intact bilaterally. Abdominal:     General: Abdomen is flat. There is no distension.     Palpations: Abdomen is soft.     Tenderness: There is no abdominal tenderness.  Musculoskeletal:     Cervical back: Normal range of motion. No rigidity.     Right lower leg: No edema.     Left lower leg: No edema.  Skin:    General: Skin is dry.  Neurological:     Mental Status: She is alert and oriented to person, place, and time.     GCS: GCS eye subscore is 4. GCS verbal subscore is 5. GCS motor subscore is 6.  Psychiatric:        Mood and Affect: Mood normal.        Behavior: Behavior normal.        Thought Content: Thought content normal.     ED Results / Procedures / Treatments   Labs (all labs ordered are listed, but only abnormal results are displayed) Labs Reviewed  RAPID INFLUENZA A&B ANTIGENS  SARS CORONAVIRUS 2 (TAT 6-24 HRS)  PREGNANCY, URINE    EKG None  Radiology DG Chest Portable 1 View  Result Date: 08/14/2020 CLINICAL DATA:  Cough and shortness of breath for 1 day. Slight fever. EXAM: PORTABLE CHEST 1 VIEW COMPARISON:  02/22/2008 FINDINGS: The heart size and mediastinal contours are within normal limits. Both lungs are clear. The visualized skeletal structures are unremarkable. IMPRESSION: Normal examination. Electronically Signed   By: 02/24/2008 M.D.   On: 08/14/2020 15:48    Procedures Procedures   Medications Ordered in ED Medications  acetaminophen (TYLENOL) tablet 650 mg (650 mg Oral Given 08/14/20 1412)  albuterol (VENTOLIN HFA) 108 (90 Base) MCG/ACT inhaler 6 puff (6 puffs Inhalation Given 08/14/20 1555)  sodium chloride 0.9 % bolus 1,000 mL (0 mLs Intravenous Stopped 08/14/20 1734)  methylPREDNISolone sodium succinate (SOLU-MEDROL) 125 mg/2 mL injection 80 mg (80 mg Intravenous Given 08/14/20 1839)  ibuprofen (ADVIL) tablet 800 mg (800 mg Oral Given 08/14/20 1946)  benzonatate (TESSALON)  capsule 100 mg (100 mg Oral Given 08/14/20 1946)    ED Course  I have reviewed the triage vital signs and the nursing notes.  Pertinent labs & imaging results that were available during my care of the patient were reviewed by me and considered in my medical decision making (see chart for details).    MDM Rules/Calculators/A&P                          10/14/20  was evaluated in Emergency Department on 08/14/2020 for the symptoms described in the history of present illness. She was evaluated in the context of the global COVID-19 pandemic, which necessitated consideration that the patient might be at risk for infection with the SARS-CoV-2 virus that causes COVID-19. Institutional protocols and algorithms that pertain to the evaluation of patients at risk for COVID-19 are in a state of rapid change based on information released by regulatory bodies including the CDC and federal and state organizations. These policies and algorithms were followed during the patient's care in the ED.  I personally reviewed patient's medical chart and all notes from triage and staff during today's encounter. I have also ordered and reviewed all labs and imaging that I felt to be medically necessary in the evaluation of this patient's complaints and with consideration of their physical exam. If needed, translation services were available and utilized.   Patient endorses a history of seasonal allergy related asthma and states that this feels exactly the same.  She does have a primary care provider with whom she can follow-up.  She was apprised that she was borderline febrile here in the ED and denies any obvious sick contacts.  She has received 1 COVID-19 vaccination and is due for her second.    She denies any chest pain, pleuritic symptoms, history of clots or clotting disorder, hemoptysis, or other symptoms concerning for pulmonary embolism.  She is well-appearing despite tachycardia and tachypnea noted in triage.   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  Will encourage patient to drink lots of fluids, try albuterol x6 with spacer, and then will reassess.  I reviewed patient's medical record and she was evaluated on 09/25/2019 for knee pain and was noted to be tachycardic to 125 during the ED encounter as well.  Chest x-ray is personally reviewed and without any acute cardiopulmonary disease.  She endorsed mild improvement with albuterol x6, but continues to be mildly short of breath due to her persistent cough.  While she is mildly tachycardic and borderline febrile here in the ED, encouraging symptomatic management.  Given that she is also endorsing rhinorrhea and has a job in which she is exposed to many people, suspect she may have a viral URI.  That would explain why she is borderline febrile requiring treatment with Tylenol and ibuprofen here in the ED.  She does not appear to be in any acute distress and is demonstrating no increased work of breathing.    Their symptoms are likely of viral etiology and we discussed that antibiotics are not indicated for viral infections.  Patient will be discharged with symptomatic treatment.  Patient is tolerating food and liquid without difficulty and I do not believe that laboratory work-up would yield any significant findings.  Low suspicion for electrolyte derangement, but emphasized the importance of eating regularly.  I also emphasized the importance of rest, continued oral hydration, and antipyretics as needed for fever control.    They were provided opportunity to ask any additional questions and have none at this time.  Prior to discharge patient is feeling well, agreeable with plan for discharge home.  They have expressed understanding of verbal discharge instructions as well as return precautions and are agreeable to the plan.   Final Clinical Impression(s) / ED  Diagnoses Final diagnoses:  Viral URI with cough    Rx / DC Orders ED  Discharge Orders         Ordered    benzonatate (TESSALON) 100 MG capsule  Every 8 hours        08/14/20 1956           Elvera Maria 08/14/20 1957    Virgina Norfolk, DO 08/14/20 2220

## 2020-08-15 LAB — SARS CORONAVIRUS 2 (TAT 6-24 HRS): SARS Coronavirus 2: NEGATIVE

## 2021-08-12 ENCOUNTER — Ambulatory Visit: Payer: BC Managed Care – PPO | Admitting: Family Medicine

## 2021-09-21 IMAGING — CR DG KNEE COMPLETE 4+V*L*
4 series · 4 of 4 positions shown · non-contrast
Comparison: 02/03/2013

CLINICAL DATA: Left knee pain for 7 days

EXAM:
LEFT KNEE - COMPLETE 4+ VIEW

[t knee ap left]
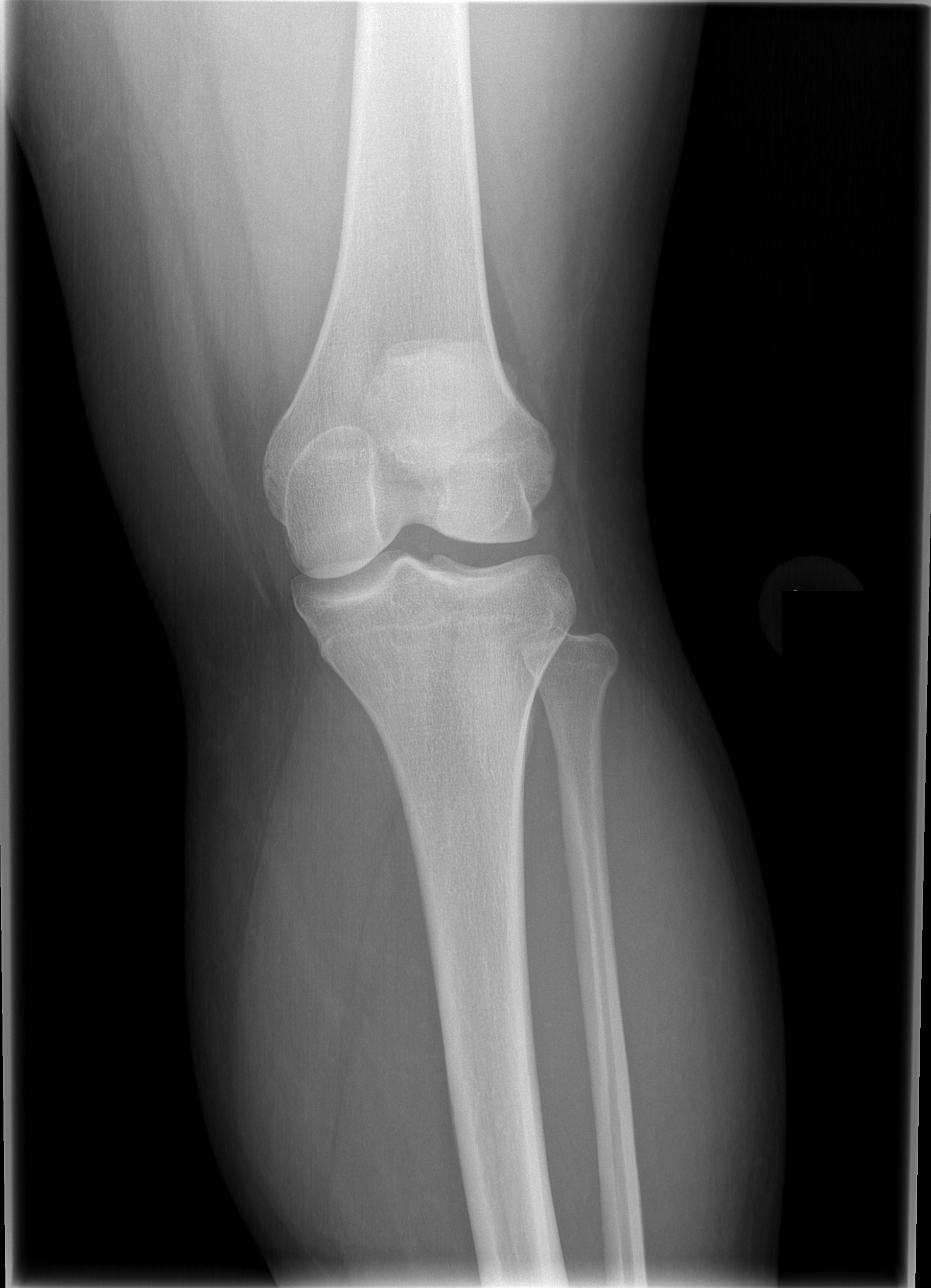

[t knee oblique left (1 of 2)]
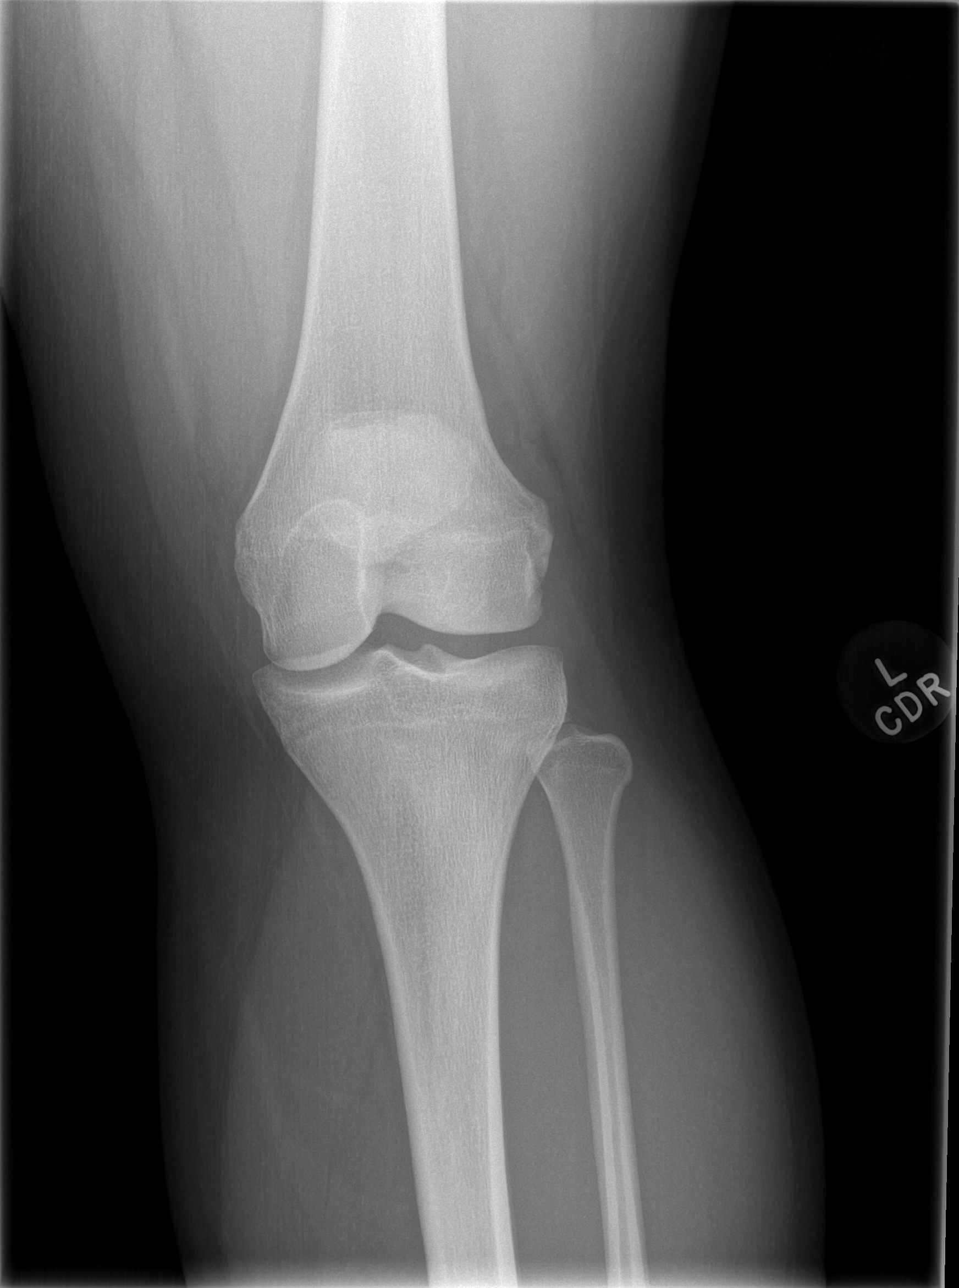

[t knee oblique left (2 of 2)]
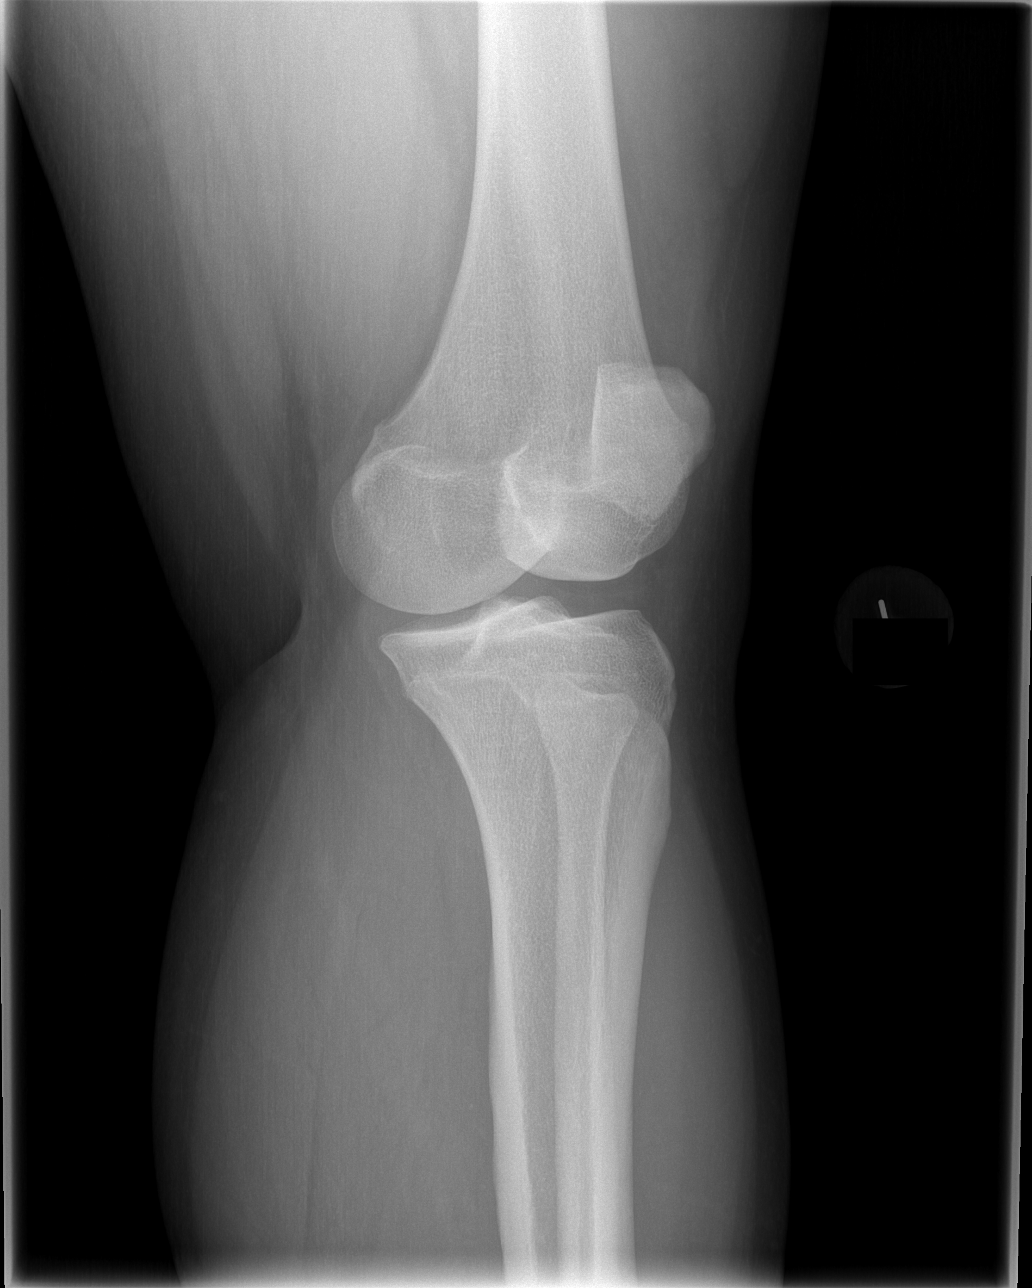

[t knee lat left]
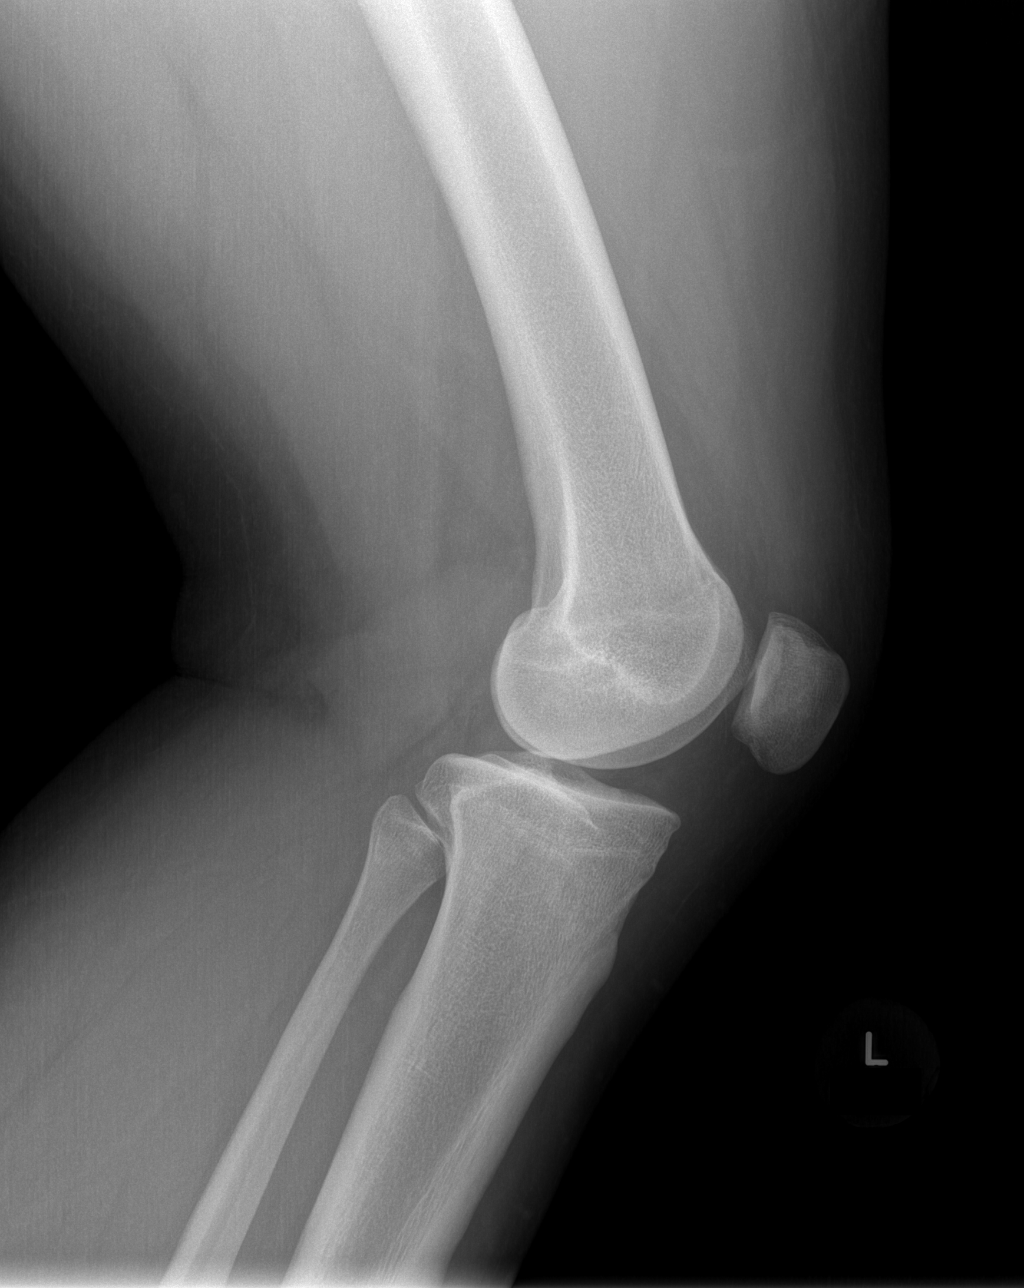

[4 of 4 positions shown; findings below may reference images not displayed]

FINDINGS: Frontal, bilateral oblique, lateral views of the left knee
demonstrate no fractures. Alignment is anatomic. Joint spaces are
well preserved. No joint effusion.
IMPRESSION: 1. Stable unremarkable left knee.

## 2022-08-14 ENCOUNTER — Encounter (HOSPITAL_BASED_OUTPATIENT_CLINIC_OR_DEPARTMENT_OTHER): Payer: Self-pay

## 2022-08-14 ENCOUNTER — Other Ambulatory Visit: Payer: Self-pay

## 2022-08-14 ENCOUNTER — Emergency Department (HOSPITAL_BASED_OUTPATIENT_CLINIC_OR_DEPARTMENT_OTHER)
Admission: EM | Admit: 2022-08-14 | Discharge: 2022-08-14 | Disposition: A | Discharge status: Home / Self Care | Payer: BC Managed Care – PPO | Attending: Emergency Medicine | Admitting: Emergency Medicine

## 2022-08-14 DIAGNOSIS — J45901 Unspecified asthma with (acute) exacerbation: Secondary | ICD-10-CM | POA: Insufficient documentation

## 2022-08-14 MED ORDER — ALBUTEROL SULFATE HFA 108 (90 BASE) MCG/ACT IN AERS: 2.0000 | INHALATION_SPRAY | Freq: Four times a day (QID) | RESPIRATORY_TRACT | 0 refills | Status: AC | PRN

## 2022-08-14 MED ORDER — IPRATROPIUM-ALBUTEROL 0.5-2.5 (3) MG/3ML IN SOLN
3.0000 mL | Freq: Once | RESPIRATORY_TRACT | Status: AC
  Administered 2022-08-14: 3 mL via RESPIRATORY_TRACT
  Filled 2022-08-14: qty 3

## 2022-08-14 MED ORDER — METHYLPREDNISOLONE SODIUM SUCC 125 MG IJ SOLR
125.0000 mg | Freq: Once | INTRAMUSCULAR | Status: AC
  Administered 2022-08-14: 125 mg via INTRAVENOUS
  Filled 2022-08-14: qty 2

## 2022-08-14 MED ORDER — PREDNISONE 50 MG PO TABS: 50.0000 mg | ORAL_TABLET | Freq: Every day | ORAL | 0 refills | Status: DC

## 2022-08-14 NOTE — Discharge Instructions (Signed)
Please avoid exposure to aerosolized dust and to smoke.  Use the rescue inhaler as needed.  Return if you have shortness of breath or wheezing which is not relieved by the rescue inhaler.

## 2022-08-14 NOTE — ED Provider Notes (Signed)
[place] [facility]ERGENCY DEPARTMENT AT [facility] Provider Note   CSN: [id number] Arrival date & time: 08/14/22  0357     History  Chief Complaint  Patient presents with   Allergic Reaction    Felicia Castro is a 27 y.o. female.  The history is provided by the patient.  Allergic Reaction She relates that a herbal mix powder spilled and when she tried to clean it up, she started to get short of breath and started wheezing.  She does have history of asthma.  She denies any rash and denies any difficulty swallowing.  She admits to marijuana smoking daily, denies tobacco use.   Home Medications Prior to Admission medications   Medication Sig Start Date End Date Taking? Authorizing Provider  albuterol (PROVENTIL HFA;VENTOLIN HFA) 108 (90 BASE) MCG/ACT inhaler Inhale 2 puffs into the lungs every 6 (six) hours as needed for wheezing.    [provider]  benzonatate (TESSALON) 100 MG capsule Take 1 capsule (100 mg total) by mouth every 8 (eight) hours. 08/14/20   Lorelee New, PA-C  EPIPEN 2-PAK 0.3 MG/0.3ML SOAJ injection  02/01/13   [provider]  ibuprofen (ADVIL,MOTRIN) 600 MG tablet Take 1 tablet (600 mg total) by mouth every 6 (six) hours as needed. 07/15/15   Horton, Mayer Masker, MD  naproxen (NAPROSYN) 500 MG tablet Take 1 tablet (500 mg total) 2 (two) times daily by mouth. 03/08/17   Vanetta Mulders, MD  naproxen sodium (ANAPROX) 220 MG tablet Take 220 mg by mouth 2 (two) times daily with a meal.    [provider]      Allergies    Apple juice, Carrot [daucus carota], Egg-derived products, and Pollen extract    Review of Systems   Review of Systems  All other systems reviewed and are negative.   Physical Exam Updated Vital Signs BP (!) 138/109 (BP Location: Right Arm)   Pulse (!) 109   Temp 98.3 F (36.8 C) (Oral)   Resp (!) 26   Ht 5\' 3"  (1.6 m)   Wt 99.8 kg   LMP 08/01/2022 (Exact Date)   SpO2 92%   BMI 38.97 kg/m  Physical  Exam Vitals and nursing note reviewed.   27 year old female, resting comfortably and in no acute distress. Vital signs are significant for elevated respiratory rate, heart rate, blood pressure. Oxygen saturation is 92%, which is normal. Head is normocephalic and atraumatic. PERRLA, EOMI. Oropharynx is clear. Neck is nontender and supple without adenopathy. Lungs have diffuse inspiratory and expiratory wheezes.  There are no rales or rhonchi. Chest is nontender. Heart has regular rate and rhythm without murmur. Abdomen is soft, flat, nontender. Extremities have no cyanosis or edema, full range of motion is present. Skin is warm and dry without rash. Neurologic: Mental status is normal, cranial nerves are intact, moves all extremities equally.  ED Results / Procedures / Treatments    EKG EKG Interpretation  Date/Time:  Thursday August 14 2022 04:10:45 EDT Ventricular Rate:  101 PR Interval:  114 QRS Duration: 89 QT Interval:  325 QTC Calculation: 422 R Axis:   70 Text Interpretation: Sinus tachycardia Borderline T wave abnormalities When compared with ECG of 08/14/2020, HEART RATE has decreased T wave abnormality has improved Confirmed by Dione Booze ([id number]) on 08/14/2022 4:13:16 AM  Procedures Procedures  Cardiac monitor shows sinus tachycardia, per my interpretation.  Medications Ordered in ED Medications  ipratropium-albuterol (DUONEB) 0.5-2.5 (3) MG/3ML nebulizer solution 3 mL (has no administration  in time range)  methylPREDNISolone sodium succinate (SOLU-MEDROL) 125 mg/2 mL injection 125 mg (has no administration in time range)    ED Course/ Medical Decision Making/ A&P                             Medical Decision Making Risk Prescription drug management.   Acute asthma exacerbation, likely related to dust inhalation.  No evidence of generalized allergic reaction/anaphylaxis.  I have ordered intravenous methylprednisolone and a nebulizer treatment with albuterol and  ipratropium.  I have reviewed and interpreted her electrocardiogram, and my interpretation is borderline T wave changes which are improved compared with prior ECG.  She initially had mild hypoxia on room air and was placed on supplemental oxygen with improvement in oxygenation.  Following above-noted treatment, lungs are completely clear and she feels much better.  I have taken her off of oxygen and she is now maintaining adequate oxygen saturation on room air.  I have kept her in the emergency department for an additional 2 hours for observation with no recurrence of wheezing or hypoxia.  At this point, she is safe for discharge.  I am discharging her with prescriptions for a 5-day course of prednisone and also for an albuterol inhaler.  I recommended that she avoid aerosolized dust and smoke, including marijuana smoke.  Final Clinical Impression(s) / ED Diagnoses Final diagnoses:  Exacerbation of asthma, unspecified asthma severity, unspecified whether persistent    Rx / DC Orders ED Discharge Orders          Ordered    albuterol (VENTOLIN HFA) 108 (90 Base) MCG/ACT inhaler  Every 6 hours PRN        08/14/22 0624    predniSONE (DELTASONE) 50 MG tablet  Daily        08/14/22 0624              Dione Booze, MD 08/14/22 [phone]

## 2022-08-14 NOTE — ED Triage Notes (Addendum)
Pt reports an herbal mix powder spilled around 3am. It woke pt up from her sleep. She attempted to try and clean it up and started to wheeze and became short of breath. Pt is tachypneic with labored breathing. Teary eyed and tripoding while sitting on stretcher.

## 2022-12-08 ENCOUNTER — Emergency Department (HOSPITAL_BASED_OUTPATIENT_CLINIC_OR_DEPARTMENT_OTHER)
Admission: EM | Admit: 2022-12-08 | Discharge: 2022-12-08 | Disposition: A | Discharge status: Home / Self Care | Payer: Self-pay | Attending: Emergency Medicine | Admitting: Emergency Medicine

## 2022-12-08 ENCOUNTER — Other Ambulatory Visit: Payer: Self-pay

## 2022-12-08 DIAGNOSIS — M795 Residual foreign body in soft tissue: Secondary | ICD-10-CM

## 2022-12-08 DIAGNOSIS — T162XXA Foreign body in left ear, initial encounter: Secondary | ICD-10-CM | POA: Diagnosis not present

## 2022-12-08 DIAGNOSIS — W448XXA Other foreign body entering into or through a natural orifice, initial encounter: Secondary | ICD-10-CM | POA: Insufficient documentation

## 2022-12-08 MED ORDER — CIPROFLOXACIN HCL 500 MG PO TABS: 500.0000 mg | ORAL_TABLET | Freq: Two times a day (BID) | ORAL | 0 refills | Status: AC

## 2022-12-08 MED ORDER — LIDOCAINE HCL (PF) 1 % IJ SOLN
10.0000 mL | Freq: Once | INTRAMUSCULAR | Status: AC
  Administered 2022-12-08: 10 mL
  Filled 2022-12-08: qty 10

## 2022-12-08 MED ORDER — CIPROFLOXACIN HCL 500 MG PO TABS: 500.0000 mg | ORAL_TABLET | Freq: Two times a day (BID) | ORAL | 0 refills | Status: DC

## 2022-12-08 NOTE — ED Notes (Signed)
EDP at [facility] 

## 2022-12-08 NOTE — ED Provider Notes (Signed)
[place] [facility]GENCY DEPARTMENT AT [facility] Provider Note   CSN: [id number] Arrival date & time: 12/08/22  1507     History Chief Complaint  Patient presents with   Foreign Body in Ear    HPI Felicia Castro is a 27 y.o. female presenting for Chief complaint of left earlobe swelling and foreign body stuck..   Patient's recorded medical, surgical, social, medication list and allergies were reviewed in the Snapshot window as part of the initial history.   Review of Systems   Review of Systems  Constitutional:  Negative for chills and fever.  HENT:  Negative for ear pain and sore throat.   Eyes:  Negative for pain and visual disturbance.  Respiratory:  Negative for cough and shortness of breath.   Cardiovascular:  Negative for chest pain and palpitations.  Gastrointestinal:  Negative for abdominal pain and vomiting.  Genitourinary:  Negative for dysuria and hematuria.  Musculoskeletal:  Negative for arthralgias and back pain.  Skin:  Negative for color change and rash.  Neurological:  Negative for seizures and syncope.  All other systems reviewed and are negative.   Physical Exam Updated Vital Signs BP 111/88 (BP Location: Left Arm)   Pulse (!) 104   Temp 98.1 F (36.7 C) (Oral)   Resp 18   Ht 5\' 3"  (1.6 m)   Wt 99.8 kg   SpO2 98%   BMI 38.97 kg/m  Physical Exam Vitals and nursing note reviewed.  Constitutional:      General: She is not in acute distress.    Appearance: She is well-developed.  HENT:     Head: Normocephalic and atraumatic.  Eyes:     Conjunctiva/sclera: Conjunctivae normal.  Cardiovascular:     Rate and Rhythm: Normal rate and regular rhythm.     Heart sounds: No murmur heard. Pulmonary:     Effort: Pulmonary effort is normal. No respiratory distress.     Breath sounds: Normal breath sounds.  Abdominal:     General: There is no distension.     Palpations: Abdomen is soft.     Tenderness: There is no abdominal tenderness. There  is no right CVA tenderness or left CVA tenderness.  Musculoskeletal:        General: Deformity (Substantial swelling of the left earlobe.) present. No swelling or tenderness. Normal range of motion.     Cervical back: Neck supple.  Skin:    General: Skin is warm and dry.  Neurological:     General: No focal deficit present.     Mental Status: She is alert and oriented to person, place, and time. Mental status is at baseline.     Cranial Nerves: No cranial nerve deficit.      ED Course/ Medical Decision Making/ A&P    Procedures .Foreign Body Removal  Date/Time: 12/08/2022 4:00 PM  Performed by: Glyn Ade, MD Authorized by: Glyn Ade, MD  Consent: Verbal consent obtained. Risks and benefits: risks, benefits and alternatives were discussed Consent given by: patient Body area: ear Location details: left ear Anesthesia: local infiltration  Anesthesia: Local Anesthetic: lidocaine 1% without epinephrine Anesthetic total: 5 mL Patient cooperative: yes Localization method: probed Removal mechanism: alligator forceps and irrigation Complexity: complex 1 objects recovered. Objects recovered: Back of an earring. Post-procedure assessment: foreign body removed Comments: Foreign body is intact. Full body removed.  Ultrasound ED Soft Tissue  Date/Time: 12/08/2022 4:01 PM  Performed by: Glyn Ade, MD Authorized by: Glyn Ade, MD   Procedure details:  Indications: evaluate for foreign body     Transverse view:  Visualized   Longitudinal view:  Visualized   Images: archived     Limitations:  Body habitus Location:    Location: head     Side:  Left Findings:     foreign body present    Medications Ordered in ED Medications  lidocaine (PF) (XYLOCAINE) 1 % injection 10 mL (10 mLs Infiltration Given by Other 12/08/22 1539)    Medical Decision Making:   This a 27 year old female presenting with a chief complaint of foreign body in the ear.   She has a earring back that has epithelialized into the back of her left ear.  She denies fevers chills nausea vomiting shortness of breath.  A ultrasound was performed as above that demonstrated the foreign body.  Incision and drainage was performed as above with foreign body extracted.  Appears completely intact. Given duration of symptoms, localized erythema and patient's obesity, will proceed with antibiosis to prevent otitis externa.  Will treat with ciprofloxacin and recommend patient follow-up with her PCP for reassessment. Clinical Impression:  1. Foreign body (FB) in soft tissue      Discharge   Final Clinical Impression(s) / ED Diagnoses Final diagnoses:  Foreign body (FB) in soft tissue    Rx / DC Orders ED Discharge Orders          Ordered    ciprofloxacin (CIPRO) 500 MG tablet  Every 12 hours        12/08/22 1603              Glyn Ade, MD 12/08/22 1605

## 2022-12-08 NOTE — ED Triage Notes (Signed)
Patient presents to ED via POV from home. Patient reports "I have the back of an earring stuck in my earlobe". Left ear.

## 2022-12-08 NOTE — ED Notes (Signed)
EDP at [facility] for I&D, f/b removal

## 2022-12-08 NOTE — Discharge Instructions (Addendum)
Seen today for an earring stuck in your left ear. Do not put any items in your left ear for at least 6 weeks.  You should follow-up your primary care provider for reassessment of the wound to ensure it is healing appropriately within 5 days. Will prescribe ciprofloxacin to take twice daily for the next 5 days. If you are having worsening swelling and discomfort you should follow-up with an ENT with their consult information attached.

## 2023-01-08 ENCOUNTER — Emergency Department (HOSPITAL_BASED_OUTPATIENT_CLINIC_OR_DEPARTMENT_OTHER): Payer: Self-pay

## 2023-01-08 ENCOUNTER — Encounter (HOSPITAL_BASED_OUTPATIENT_CLINIC_OR_DEPARTMENT_OTHER): Payer: Self-pay | Admitting: Emergency Medicine

## 2023-01-08 ENCOUNTER — Other Ambulatory Visit: Payer: Self-pay

## 2023-01-08 ENCOUNTER — Emergency Department (HOSPITAL_BASED_OUTPATIENT_CLINIC_OR_DEPARTMENT_OTHER)
Admission: EM | Admit: 2023-01-08 | Discharge: 2023-01-08 | Disposition: A | Discharge status: Home / Self Care | Payer: Self-pay | Attending: Emergency Medicine | Admitting: Emergency Medicine

## 2023-01-08 DIAGNOSIS — Z6839 Body mass index (BMI) 39.0-39.9, adult: Secondary | ICD-10-CM | POA: Diagnosis not present

## 2023-01-08 DIAGNOSIS — E119 Type 2 diabetes mellitus without complications: Secondary | ICD-10-CM | POA: Diagnosis not present

## 2023-01-08 DIAGNOSIS — D72829 Elevated white blood cell count, unspecified: Secondary | ICD-10-CM | POA: Insufficient documentation

## 2023-01-08 DIAGNOSIS — M79605 Pain in left leg: Secondary | ICD-10-CM | POA: Diagnosis not present

## 2023-01-08 DIAGNOSIS — Z7901 Long term (current) use of anticoagulants: Secondary | ICD-10-CM | POA: Insufficient documentation

## 2023-01-08 DIAGNOSIS — Z8249 Family history of ischemic heart disease and other diseases of the circulatory system: Secondary | ICD-10-CM | POA: Diagnosis not present

## 2023-01-08 DIAGNOSIS — I82812 Embolism and thrombosis of superficial veins of left lower extremities: Secondary | ICD-10-CM | POA: Insufficient documentation

## 2023-01-08 DIAGNOSIS — L309 Dermatitis, unspecified: Secondary | ICD-10-CM | POA: Diagnosis not present

## 2023-01-08 LAB — CBC
HCT: 37.7 % (ref 36.0–46.0)
Hemoglobin: 12.6 g/dL (ref 12.0–15.0)
MCH: 30.4 pg (ref 26.0–34.0)
MCHC: 33.4 g/dL (ref 30.0–36.0)
MCV: 91.1 fL (ref 80.0–100.0)
Platelets: 283 10*3/uL (ref 150–400)
RBC: 4.14 MIL/uL (ref 3.87–5.11)
RDW: 12.5 % (ref 11.5–15.5)
WBC: 6.1 10*3/uL (ref 4.0–10.5)
nRBC: 0 % (ref 0.0–0.2)

## 2023-01-08 LAB — BASIC METABOLIC PANEL
Anion gap: 7 (ref 5–15)
BUN: 10 mg/dL (ref 6–20)
CO2: 23 mmol/L (ref 22–32)
Calcium: 8.5 mg/dL — ABNORMAL LOW (ref 8.9–10.3)
Chloride: 104 mmol/L (ref 98–111)
Creatinine, Ser: 0.78 mg/dL (ref 0.44–1.00)
GFR, Estimated: >60 mL/min (ref >60)
Glucose, Bld: 90 mg/dL (ref 70–99)
Potassium: 3.9 mmol/L (ref 3.5–5.1)
Sodium: 134 mmol/L — ABNORMAL LOW (ref 135–145)

## 2023-01-08 LAB — PREGNANCY, URINE: Preg Test, Ur: NEGATIVE

## 2023-01-08 MED ORDER — APIXABAN (ELIQUIS) EDUCATION KIT FOR DVT/PE PATIENTS: PACK | Freq: Once | Status: AC

## 2023-01-08 MED ORDER — APIXABAN 2.5 MG PO TABS
10.0000 mg | ORAL_TABLET | ORAL | Status: AC
  Administered 2023-01-08: 10 mg via ORAL
  Filled 2023-01-08: qty 4

## 2023-01-08 MED ORDER — APIXABAN (ELIQUIS) VTE STARTER PACK (10MG AND 5MG)
ORAL_TABLET | ORAL | 0 refills | Status: DC
  Filled 2023-01-09: qty 74, 28d supply, fill #0

## 2023-01-08 NOTE — Discharge Instructions (Addendum)
You were seen in the ER today for evaluation of your left lower leg pain. There is a blood clot present.  Speaking with vascular surgery, it was decided to start you on a blood thinner.  I have included more information on Eliquis into the discharge paperwork.  Please review.  Ultimately, you may need to be on this medication for a few months, so you will need to follow-up with the referrals.  You need to follow-up with hematology which is Dr. Truett Perna, please call to schedule an appointment.  If for the reason you are not able to be seen within the month, you will need to follow-up with Dr. Karin Lieu. When you call the clinic for Dr. Karin Lieu, ask to be scheduled with the DVT clinic.  You will need to be compliant with your blood thinner medication and take it twice daily.  I have prescribed you the starter pack.  Please take as directed.  If you have any chest pain, shortness of breath, worsening leg pain, worsening leg swelling, numbness, tingling, color changes, please return to your nearest emergency department for evaluation.  You have any concerns, new or worsening symptoms, please return to your nearest emergency department for reevaluation.  Contact a health care provider if: You miss a dose of your blood thinner. You have unusual bruising or other color changes. You have new or worse pain, swelling, or redness in an arm or a leg. You have worsening numbness or tingling in an arm or a leg. You have a significant color change (pale or blue) in the extremity that has the DVT. Get help right away if: You have signs or symptoms that a blood clot has moved to the lungs. These may include: Shortness of breath. Chest pain. Fast or irregular heartbeats (palpitations). Light-headedness, dizziness, or fainting. Coughing up blood. You have signs or symptoms that your blood is too thin. These may include: Blood in your vomit, stool, or urine. A cut that will not stop bleeding. A menstrual period that is  heavier than usual. A severe headache or confusion. These symptoms may be an emergency. Get help right away. Call 911. Do not wait to see if the symptoms will go away. Do not drive yourself to the hospital.

## 2023-01-08 NOTE — ED Provider Notes (Signed)
[place] EMERGENCY DEPARTMENT AT [facility] Provider Note   CSN: [id number] Arrival date & time: 01/08/23  1746     History Chief Complaint  Patient presents with   Leg Pain    Felicia Castro is a 27 y.o. female reportedly otherwise healthy presents to the emerged from today for evaluation of left lower leg pain and swelling for the past week.  She was sent over by her primary care doctor for possible blood clot.  She reports it has been gradually worsening over the past week. The pain is mainly in the medial left calf.  She denies any chest pain or shortness of breath.  She denies any fevers or trauma to the area.  She denies any numbness or tingling into her leg or any weakness. She denies any long travel, history of DVT/PE, personal history of cancer, and anxiety as hormone use, or recent surgeries.  She does report that her father and grandfather both have protein S deficiency.  She has no allergies to medication.  Daily tobacco use.  Occasional marijuana use.   Leg Pain Associated symptoms: no fever        Home Medications Prior to Admission medications   Medication Sig Start Date End Date Taking? Authorizing Provider  albuterol (VENTOLIN HFA) 108 (90 Base) MCG/ACT inhaler Inhale 2 puffs into the lungs every 6 (six) hours as needed for wheezing or shortness of breath (or coughing). 08/14/22   Dione Booze, MD  benzonatate (TESSALON) 100 MG capsule Take 1 capsule (100 mg total) by mouth every 8 (eight) hours. 08/14/20   Lorelee New, PA-C  ciprofloxacin (CIPRO) 500 MG tablet Take 1 tablet (500 mg total) by mouth every 12 (twelve) hours. 12/08/22   Glyn Ade, MD  EPIPEN 2-PAK 0.3 MG/0.3ML SOAJ injection  02/01/13   [provider]  ibuprofen (ADVIL,MOTRIN) 600 MG tablet Take 1 tablet (600 mg total) by mouth every 6 (six) hours as needed. 07/15/15   Horton, Mayer Masker, MD  naproxen (NAPROSYN) 500 MG tablet Take 1 tablet (500 mg total) 2 (two) times daily  by mouth. 03/08/17   Vanetta Mulders, MD  naproxen sodium (ANAPROX) 220 MG tablet Take 220 mg by mouth 2 (two) times daily with a meal.    [provider]  predniSONE (DELTASONE) 50 MG tablet Take 1 tablet (50 mg total) by mouth daily. 08/14/22   Dione Booze, MD      Allergies    Apple juice, Carrot [daucus carota], Egg-derived products, and Pollen extract    Review of Systems   Review of Systems  Constitutional:  Negative for chills and fever.  Respiratory:  Negative for shortness of breath.   Cardiovascular:  Positive for leg swelling. Negative for chest pain.  Neurological:  Negative for weakness and numbness.    Physical Exam Updated Vital Signs BP 124/86 (BP Location: Left Arm)   Pulse 91   Temp 98.4 F (36.9 C) (Oral)   Resp 18   Wt 102.5 kg   LMP 12/17/2022 (Exact Date)   SpO2 100%   BMI 40.03 kg/m  Physical Exam Vitals and nursing note reviewed.  Constitutional:      General: She is not in acute distress.    Appearance: She is not toxic-appearing.  Eyes:     General: No scleral icterus. Cardiovascular:     Rate and Rhythm: Normal rate.  Pulmonary:     Effort: Pulmonary effort is normal. No respiratory distress.  Musculoskeletal:  General: Swelling and tenderness present.     Comments: Some swelling noted to the lower left leg. Some increase in erythema and warmth to the calf. Compartments are soft. Palpable DP and PT pulses with brisk cap refill on all five toes. Reportedly sensation is intact and symmetric per patient. She is able to flex and extend the ankle and knee. There is no overlying warmth or erythema over a joint. No rash or blistering noted.   Skin:    General: Skin is warm and dry.  Neurological:     General: No focal deficit present.     Mental Status: She is alert.     Motor: No weakness.     ED Results / Procedures / Treatments   Labs (all labs ordered are listed, but only abnormal results are displayed) Labs Reviewed - No  data to display  EKG None  Radiology US Venous Img Lower Unilateral Left  Result Date: 01/08/2023 CLINICAL DATA:  Left leg pain and swelling. EXAM: LEFT LOWER EXTREMITY VENOUS DOPPLER ULTRASOUND TECHNIQUE: Gray-scale sonography with graded compression, as well as color Doppler and duplex ultrasound were performed to evaluate the lower extremity deep venous systems from the level of the common femoral vein and including the common femoral, femoral, profunda femoral, popliteal and calf veins including the posterior tibial, peroneal and gastrocnemius veins when visible. The superficial great saphenous vein was also interrogated. Spectral Doppler was utilized to evaluate flow at rest and with distal augmentation maneuvers in the common femoral, femoral and popliteal veins. COMPARISON:  None Available. FINDINGS: Contralateral Common Femoral Vein: Respiratory phasicity is normal and symmetric with the symptomatic side. No evidence of thrombus. Normal compressibility. Common Femoral Vein: No evidence of thrombus. Normal compressibility, respiratory phasicity and response to augmentation. Saphenofemoral Junction: No evidence of thrombus. Normal compressibility and flow on color Doppler imaging. Profunda Femoral Vein: No evidence of thrombus. Normal compressibility and flow on color Doppler imaging. Femoral Vein: No evidence of thrombus. Normal compressibility, respiratory phasicity and response to augmentation. Popliteal Vein: No evidence of thrombus. Normal compressibility, respiratory phasicity and response to augmentation. Calf Veins: No evidence of thrombus. Normal compressibility and flow on color Doppler imaging. Superficial Great Saphenous Vein: No evidence of thrombus. Normal compressibility. Venous Reflux:  None. Other Findings: Thrombus is seen within the LEFT greater saphenous vein from the level of the LEFT knee to the LEFT ankle. Deep reflux and varicose veins are noted by the ultrasound technologist.  IMPRESSION: 1. No evidence of deep venous thrombosis within the LEFT lower extremity. 2. Superficial thrombus within the LEFT greater saphenous vein, as described above. Electronically Signed   By: Aram Candela M.D.   On: 01/08/2023 19:51    Procedures Procedures   Medications Ordered in ED Medications - No data to display  ED Course/ Medical Decision Making/ A&P  Medical Decision Making Amount and/or Complexity of Data Reviewed Labs: ordered.  Risk Prescription drug management.   27 y.o. female presents to the ER for evaluation of left leg swelling for the past week. Differential diagnosis includes but is not limited to DVT, SVT, cellulitis, venous stasis, edema. Vital signs unremarkable. Physical exam as noted above.   DVT study ordered in triage.  I independently reviewed and interpreted the patient's labs.  Pregnancy test is negative.  BMP shows mildly decreased sodium and calcium otherwise no other electrolyte normality.  CBC with a leukocytosis or anemia.  [place] study shows  1. No evidence of deep venous thrombosis within the LEFT lower extremity.  2. Superficial thrombus within the LEFT greater saphenous vein, as described above.  I consulted vascular surgery and spoke with Dr. Karin Lieu.  Given that the superficial thrombus is present at the level of the knee and the ankle, this is borderline going into the deep system.  Given that she does have the potential to have a low protein S deficiency, this does have the potential to worsen. Dr. Karin Lieu agrees and recommends Eliquis and have her follow-up with hematology for further evaluation and refills.  Labs ordered to check kidney function before dispensing Eliquis.  I discussed with her about the risk of being on a blood thinner as well as including more additional information into the discharge paperwork.  Patient is okay with starting Eliquis.  She was given her first dose of Eliquis in the emergency department as well as an  education given on Eliquis.  She is neuro vastly intact distally.  The leg is swollen and slightly erythematous and warm but I do not have a concern for any overlying cellulitis, this is likely due to the swelling.  She is neuro vastly intact distally.  Her only symptom is some pain.  She denies any numbness or tingling.  Her compartments are soft.  I am not concerned for any compartment syndrome at this time.  She denies any chest pain or shortness of breath.  She is not tachycardic, tachypneic, or hypoxic.  I have a low concern for any PE at this time. Hematology referral and DVT clinic information placed into the discharge report.  Instructed patient that she will need to call to schedule an appointment.  She is stable for discharge home with close outpatient follow-up.  We discussed the results of the labs/imaging. The plan is take medication as prescribed, follow up with hematology, follow up with DVT clinic. We discussed strict return precautions and red flag symptoms. The patient verbalized their understanding and agrees to the plan. The patient is stable and being discharged home in good condition.  Portions of this report may have been transcribed using voice recognition software. Every effort was made to ensure accuracy; however, inadvertent computerized transcription errors may be present.   Final Clinical Impression(s) / ED Diagnoses Final diagnoses:  Acute superficial venous thrombosis of lower extremity, left    Rx / DC Orders ED Discharge Orders          Ordered    APIXABAN (ELIQUIS) VTE STARTER PACK (10MG  AND 5MG )       Note to Pharmacy: If starter pack unavailable, substitute with seventy-four 5 mg apixaban tabs following the above SIG directions.   01/08/23 2223              Achille Rich, PA-C 01/10/23 1417    Virgina Norfolk, DO 01/12/23 1050

## 2023-01-08 NOTE — ED Notes (Addendum)
Pt given education pamphlet and instructed to get Rx filled at [facility] pharmacy to ensure Starter kit availability per [facility], [facility] Quale.

## 2023-01-08 NOTE — ED Triage Notes (Signed)
Was seen at PCP for  left leg pain , swelling and hot to touch , sent her for DVT study .  Denies chest pain or shortness of breath

## 2023-01-09 ENCOUNTER — Other Ambulatory Visit (HOSPITAL_BASED_OUTPATIENT_CLINIC_OR_DEPARTMENT_OTHER): Payer: Self-pay

## 2023-01-29 DIAGNOSIS — S8992XS Unspecified injury of left lower leg, sequela: Secondary | ICD-10-CM | POA: Diagnosis not present

## 2023-01-29 DIAGNOSIS — I82402 Acute embolism and thrombosis of unspecified deep veins of left lower extremity: Secondary | ICD-10-CM | POA: Diagnosis not present

## 2023-01-29 DIAGNOSIS — F17210 Nicotine dependence, cigarettes, uncomplicated: Secondary | ICD-10-CM | POA: Diagnosis not present

## 2023-01-29 DIAGNOSIS — Z6841 Body Mass Index (BMI) 40.0 and over, adult: Secondary | ICD-10-CM | POA: Diagnosis not present

## 2023-01-29 DIAGNOSIS — Z832 Family history of diseases of the blood and blood-forming organs and certain disorders involving the immune mechanism: Secondary | ICD-10-CM | POA: Diagnosis not present

## 2023-02-02 DIAGNOSIS — I82402 Acute embolism and thrombosis of unspecified deep veins of left lower extremity: Secondary | ICD-10-CM | POA: Diagnosis not present

## 2023-02-03 DIAGNOSIS — L2084 Intrinsic (allergic) eczema: Secondary | ICD-10-CM | POA: Diagnosis not present

## 2023-03-02 ENCOUNTER — Ambulatory Visit: Payer: BC Managed Care – PPO | Admitting: Physician Assistant

## 2023-03-02 VITALS — BP 132/91 | HR 93 | Temp 98.8°F | Ht 63.0 in | Wt 222.0 lb

## 2023-03-02 DIAGNOSIS — M7989 Other specified soft tissue disorders: Secondary | ICD-10-CM

## 2023-03-02 DIAGNOSIS — I8000 Phlebitis and thrombophlebitis of superficial vessels of unspecified lower extremity: Secondary | ICD-10-CM | POA: Diagnosis not present

## 2023-03-02 NOTE — Progress Notes (Signed)
[facility] [place]   Reason for referral: Swollen left leg  History of Present Illness  Felicia Castro is a 27 y.o. female who presents with chief complaint: swollen leg.  Patient notes, onset of swelling sept. 2024  associated with no trauma or surgery.  The patient has had no history of DVT, no history of varicose vein, no history of venous stasis ulcers, no history of  Lymphedema and no history of skin changes in lower legs.  There is a family history of her father having protein S.deficiency  The patient has not used compression stockings in the past.  She was seen in the ED at [facility] and diagnosed with a superficial thrombus in the lower left GSV from the ankle to the popliteal.  She states the swelling has gone down some.  She was started on Eliquis.    Past Medical History:  Diagnosis Date   Allergy    Asthma    Eczema     Past Surgical History:  Procedure Laterality Date   HERNIA REPAIR      Social History   Socioeconomic History   Marital status: Single    Spouse name: Not on file   Number of children: Not on file   Years of education: Not on file   Highest education level: Not on file  Occupational History   Not on file  Tobacco Use   Smoking status: Every Day    Types: Cigarettes   Smokeless tobacco: Never  Vaping Use   Vaping status: Some Days  Substance and Sexual Activity   Alcohol use: Yes    Comment: social   Drug use: Yes    Types: Marijuana   Sexual activity: Never    Birth control/protection: None  Other Topics Concern   Not on file  Social History Narrative   Not on file   Social Determinants of Health   Financial Resource Strain: Not on file  Food Insecurity: Not on file  Transportation Needs: Not on file  Physical Activity: Not on file  Stress: Not on file  Social Connections: Not on file  Intimate Partner Violence: Not on file    Family History  Problem Relation Age of Onset   Hyperlipidemia Mother     Hypertension Mother    Sudden death Neg Hx    Heart attack Neg Hx    Diabetes Neg Hx     Current Outpatient Medications on File Prior to Visit  Medication Sig Dispense Refill   HYDROcodone-acetaminophen (NORCO/VICODIN) 5-325 MG tablet Take 1 tablet by mouth every 6 (six) hours as needed for moderate pain (pain score 4-6).     albuterol (VENTOLIN HFA) 108 (90 Base) MCG/ACT inhaler Inhale 2 puffs into the lungs every 6 (six) hours as needed for wheezing or shortness of breath (or coughing). 1 each 0   APIXABAN (ELIQUIS) VTE STARTER PACK (10MG  AND 5MG ) Take as directed on package: start with two-5mg  tablets twice daily for 7 days. On day 8, switch to one-5mg  tablet twice daily. 74 each 0   benzonatate (TESSALON) 100 MG capsule Take 1 capsule (100 mg total) by mouth every 8 (eight) hours. (Patient not taking: Reported on 03/02/2023) 21 capsule 0   ciprofloxacin (CIPRO) 500 MG tablet Take 1 tablet (500 mg total) by mouth every 12 (twelve) hours. (Patient not taking: Reported on 03/02/2023) 10 tablet 0   EPIPEN 2-PAK 0.3 MG/0.3ML SOAJ injection      ibuprofen (ADVIL,MOTRIN) 600 MG tablet Take 1 tablet (  600 mg total) by mouth every 6 (six) hours as needed. 30 tablet 0   naproxen (NAPROSYN) 500 MG tablet Take 1 tablet (500 mg total) 2 (two) times daily by mouth. 14 tablet 0   naproxen sodium (ANAPROX) 220 MG tablet Take 220 mg by mouth 2 (two) times daily with a meal.     predniSONE (DELTASONE) 50 MG tablet Take 1 tablet (50 mg total) by mouth daily. (Patient not taking: Reported on 03/02/2023) 5 tablet 0   No current facility-administered medications on file prior to visit.    Allergies as of 03/02/2023 - Review Complete 03/02/2023  Allergen Reaction Noted   Apple juice Anaphylaxis 08/21/2015   Carrot [daucus carota] Anaphylaxis 08/21/2015   Egg-derived products Anaphylaxis 08/21/2015   Pollen extract  08/21/2015     ROS:   General:  No weight loss, Fever, chills  HEENT: No recent  headaches, no nasal bleeding, no visual changes, no sore throat  Neurologic: No dizziness, blackouts, seizures. No recent symptoms of stroke or mini- stroke. No recent episodes of slurred speech, or temporary blindness.  Cardiac: No recent episodes of chest pain/pressure, no shortness of breath at rest.  No shortness of breath with exertion.  Denies history of atrial fibrillation or irregular heartbeat  Vascular: No history of rest pain in feet.  No history of claudication.  No history of non-healing ulcer, No history of DVT   Pulmonary: No home oxygen, no productive cough, no hemoptysis,  No asthma or wheezing  Musculoskeletal:  [ ]  Arthritis, [ ]  Low back pain,  [ ]  Joint pain  Hematologic:No history of hypercoagulable state.  No history of easy bleeding.  No history of anemia  Gastrointestinal: No hematochezia or melena,  No gastroesophageal reflux, no trouble swallowing  Urinary: [ ]  chronic Kidney disease, [ ]  on HD - [ ]  MWF or [ ]  TTHS, [ ]  Burning with urination, [ ]  Frequent urination, [ ]  Difficulty urinating;   Skin: No rashes  Psychological: No history of anxiety,  No history of depression  Physical Examination  Vitals:   03/02/23 1512  BP: (!) 132/91  Pulse: 93  Temp: 98.8 F (37.1 C)  SpO2: 98%  Weight: 222 lb (100.7 kg)  Height: 5\' 3"  (1.6 m)    Body mass index is 39.33 kg/m.  General:  Alert and oriented, no acute distress HEENT: Normal Neck: No bruit or JVD Pulmonary: Clear to auscultation bilaterally Cardiac: Regular Rate and Rhythm without murmur Abdomen: Soft, non-tender, non-distended, no mass, no scars Skin: No rash Extremity Pulses:  2+ radial, femoral, dorsalis pedis, posterior tibial pulses bilaterally Musculoskeletal: No deformity, left LE ankle to knee edema  Neurologic: Upper and lower extremity motor 5/5 and symmetric  DATA:   IMPRESSION: 1. No evidence of deep venous thrombosis within the LEFT lower extremity. 2. Superficial  thrombus within the LEFT greater saphenous vein, as described above.  Assessment/Plan: Left superficial thrombus popliteal to ankle GSV with edema.  I placed her in mild knee high compression to help with the edema and elevation.  Activity as tolerates.  She will complete the Eliquis.  I will refer her to a hematologist for work up with a family history of Protein S deficiency.  Recommendation for stopping the Eliquis verses life long anticoagulation per hematologist work up.   She is not in danger of limb loss she has palpable pedal pulses.  F/U as needed.     Mosetta Pigeon PA-C [facility] [place] Office:  [phone]  MD in clinic [place]

## 2023-03-06 ENCOUNTER — Ambulatory Visit: Payer: BC Managed Care – PPO | Admitting: Nurse Practitioner

## 2023-03-16 DIAGNOSIS — L2084 Intrinsic (allergic) eczema: Secondary | ICD-10-CM | POA: Diagnosis not present

## 2023-03-30 ENCOUNTER — Telehealth: Payer: Self-pay | Admitting: Hematology

## 2023-03-30 NOTE — Telephone Encounter (Signed)
Patient had work conflict and is unable to make it to appointment scheduled and is aware of cancellation, message sent to New Patient scheduler Felicia Castro

## 2023-03-31 ENCOUNTER — Other Ambulatory Visit: Payer: Self-pay

## 2023-03-31 ENCOUNTER — Inpatient Hospital Stay: Payer: BC Managed Care – PPO

## 2023-03-31 ENCOUNTER — Inpatient Hospital Stay: Payer: BC Managed Care – PPO | Admitting: Hematology

## 2023-03-31 ENCOUNTER — Emergency Department (HOSPITAL_BASED_OUTPATIENT_CLINIC_OR_DEPARTMENT_OTHER)
Admission: EM | Admit: 2023-03-31 | Discharge: 2023-04-01 | Disposition: A | Discharge status: Home / Self Care | Payer: BC Managed Care – PPO | Attending: Emergency Medicine | Admitting: Emergency Medicine

## 2023-03-31 ENCOUNTER — Emergency Department (HOSPITAL_BASED_OUTPATIENT_CLINIC_OR_DEPARTMENT_OTHER): Payer: BC Managed Care – PPO

## 2023-03-31 ENCOUNTER — Encounter (HOSPITAL_BASED_OUTPATIENT_CLINIC_OR_DEPARTMENT_OTHER): Payer: Self-pay | Admitting: Urology

## 2023-03-31 DIAGNOSIS — R079 Chest pain, unspecified: Secondary | ICD-10-CM | POA: Insufficient documentation

## 2023-03-31 DIAGNOSIS — R0789 Other chest pain: Secondary | ICD-10-CM | POA: Diagnosis not present

## 2023-03-31 DIAGNOSIS — T887XXA Unspecified adverse effect of drug or medicament, initial encounter: Secondary | ICD-10-CM | POA: Diagnosis not present

## 2023-03-31 DIAGNOSIS — R0602 Shortness of breath: Secondary | ICD-10-CM | POA: Diagnosis not present

## 2023-03-31 DIAGNOSIS — J45909 Unspecified asthma, uncomplicated: Secondary | ICD-10-CM | POA: Diagnosis not present

## 2023-03-31 MED ORDER — LORAZEPAM 1 MG PO TABS
1.0000 mg | ORAL_TABLET | Freq: Once | ORAL | Status: AC
  Administered 2023-03-31: 1 mg via ORAL
  Filled 2023-03-31: qty 1

## 2023-03-31 NOTE — ED Notes (Signed)
Pt when asked can open eyes and look at me, answers all questions correctly, A&O x 4, following commands

## 2023-03-31 NOTE — ED Provider Notes (Signed)
[facility] Emergency Department Provider Note MRN:  [id number]  Arrival date & time: 04/01/23     Chief Complaint   Chest Pain and Eddible Reaction    History of Present Illness   Felicia Castro is a 27 y.o. year-old female with a history of asthma presenting to the ED with chief complaint of chest pain.  Chest pain and shortness of breath, took an edible this evening, not sure if her symptoms are in her head and related to the edible or if she is truly short of breath.  Feeling panicked.  Review of Systems  A thorough review of systems was obtained and all systems are negative except as noted in the HPI and PMH.   Patient's Health History    Past Medical History:  Diagnosis Date   Allergy    Asthma    Eczema     Past Surgical History:  Procedure Laterality Date   HERNIA REPAIR      Family History  Problem Relation Age of Onset   Hyperlipidemia Mother    Hypertension Mother    Sudden death Neg Hx    Heart attack Neg Hx    Diabetes Neg Hx     Social History   Socioeconomic History   Marital status: Single    Spouse name: Not on file   Number of children: Not on file   Years of education: Not on file   Highest education level: Not on file  Occupational History   Not on file  Tobacco Use   Smoking status: Every Day    Types: Cigarettes   Smokeless tobacco: Never  Vaping Use   Vaping status: Some Days  Substance and Sexual Activity   Alcohol use: Yes    Comment: social   Drug use: Yes    Types: Marijuana   Sexual activity: Never    Birth control/protection: None  Other Topics Concern   Not on file  Social History Narrative   Not on file   Social Determinants of Health   Financial Resource Strain: Not on file  Food Insecurity: Not on file  Transportation Needs: Not on file  Physical Activity: Not on file  Stress: Not on file  Social Connections: Not on file  Intimate Partner Violence: Not on file     Physical Exam    Vitals:   04/01/23 0045 04/01/23 0100  BP: (!) 103/55 106/70  Pulse: 88 87  Resp: 18 17  Temp:    SpO2:  95%    CONSTITUTIONAL: Well-appearing, NAD NEURO/PSYCH: Conversant, keeps eyes closed, anxious, moves all extremities EYES:  eyes equal and reactive ENT/NECK:  no LAD, no JVD CARDIO: Tachycardic rate, well-perfused, normal S1 and S2 PULM:  CTAB no wheezing or rhonchi GI/GU:  non-distended, non-tender MSK/SPINE:  No gross deformities, no edema SKIN:  no rash, atraumatic   *Additional and/or pertinent findings included in MDM below  Diagnostic and Interventional Summary    EKG Interpretation Date/Time:  Tuesday March 31 2023 23:37:04 EST Ventricular Rate:  110 PR Interval:    QRS Duration:  91 QT Interval:  295 QTC Calculation: 399 R Axis:   71  Text Interpretation: Sinus tachycardia Confirmed by Kennis Carina [phone]) on 03/31/2023 11:45:15 PM       Labs Reviewed - No data to display  DG Chest Port 1 View  Final Result      Medications  LORazepam (ATIVAN) tablet 1 mg (1 mg Oral Given 03/31/23 2334)  Procedures  /  Critical Care Procedures  ED Course and Medical Decision Making  Initial Impression and Ddx Suspect side effect of THC gummy with the tachycardia, anxiety, chest pain shortness of breath.  Otherwise a healthy 27 year old.  Obtaining screening EKG and chest x-ray to exclude evidence of pneumothorax or any evidence of ischemia.  No leg pain or swelling, highly doubt PE.  Providing Ativan and will reassess.  Past medical/surgical history that increases complexity of ED encounter: None  Interpretation of Diagnostics I personally reviewed the EKG and my interpretation is as follows: Sinus tachycardia  Chest x-ray unremarkable.  Patient Reassessment and Ultimate Disposition/Management     On reassessment tachycardia is resolved, patient sleeping comfortably, no increased work of breathing, appropriate for discharge.  Patient management  required discussion with the following services or consulting groups:  None  Complexity of Problems Addressed Acute illness or injury that poses threat of life of bodily function  Additional Data Reviewed and Analyzed Further history obtained from: Further history from spouse/family member  Additional Factors Impacting ED Encounter Risk None  Elmer Sow. Pilar Plate, MD [facility] Emergency Medicine [facility] [email or web address]  Final Clinical Impressions(s) / ED Diagnoses     ICD-10-CM   1. Chest pain, unspecified type  R07.9     2. Drug side effects  [email or web address]       ED Discharge Orders     None        Discharge Instructions Discussed with and Provided to Patient:     Discharge Instructions      You were evaluated in the Emergency Department and after careful evaluation, we did not find any emergent condition requiring admission or further testing in the hospital.  Your exam/testing today is overall reassuring.  Suspect symptoms are related to anxiety and/or drug side effects.  Please return to the Emergency Department if you experience any worsening of your condition.   Thank you for allowing [place] to be a part of your care.       Sabas Sous, MD 04/01/23 [phone]

## 2023-03-31 NOTE — ED Triage Notes (Signed)
Pt states took THC eddible approx 2 hrs pta  Started to have intermittent chest pressure with SOB  Episodic in nature   States I'm not sure if I'm just high or if I truly can't  breathe

## 2023-04-01 NOTE — Discharge Instructions (Addendum)
You were evaluated in the Emergency Department and after careful evaluation, we did not find any emergent condition requiring admission or further testing in the hospital.  Your exam/testing today is overall reassuring.  Suspect symptoms are related to anxiety and/or drug side effects.  Please return to the Emergency Department if you experience any worsening of your condition.   Thank you for allowing [place] to be a part of your care.

## 2023-04-11 ENCOUNTER — Inpatient Hospital Stay: Payer: BC Managed Care – PPO | Attending: Hematology | Admitting: Hematology

## 2023-04-11 ENCOUNTER — Inpatient Hospital Stay: Payer: BC Managed Care – PPO

## 2023-04-11 VITALS — BP 140/83 | HR 99 | Temp 98.1°F | Resp 20 | Wt 222.8 lb

## 2023-04-11 DIAGNOSIS — I82812 Embolism and thrombosis of superficial veins of left lower extremities: Secondary | ICD-10-CM

## 2023-04-11 DIAGNOSIS — F17210 Nicotine dependence, cigarettes, uncomplicated: Secondary | ICD-10-CM | POA: Diagnosis not present

## 2023-04-11 DIAGNOSIS — F17290 Nicotine dependence, other tobacco product, uncomplicated: Secondary | ICD-10-CM | POA: Diagnosis not present

## 2023-04-11 DIAGNOSIS — Z832 Family history of diseases of the blood and blood-forming organs and certain disorders involving the immune mechanism: Secondary | ICD-10-CM | POA: Diagnosis not present

## 2023-04-11 DIAGNOSIS — Z8249 Family history of ischemic heart disease and other diseases of the circulatory system: Secondary | ICD-10-CM

## 2023-04-11 DIAGNOSIS — Z7901 Long term (current) use of anticoagulants: Secondary | ICD-10-CM | POA: Insufficient documentation

## 2023-04-11 DIAGNOSIS — Z83438 Family history of other disorder of lipoprotein metabolism and other lipidemia: Secondary | ICD-10-CM | POA: Insufficient documentation

## 2023-04-11 LAB — CMP (CANCER CENTER ONLY)
ALT: 16 U/L (ref 0–44)
AST: 15 U/L (ref 15–41)
Albumin: 4 g/dL (ref 3.5–5.0)
Alkaline Phosphatase: 37 U/L — ABNORMAL LOW (ref 38–126)
Anion gap: 5 (ref 5–15)
BUN: 11 mg/dL (ref 6–20)
CO2: 25 mmol/L (ref 22–32)
Calcium: 9.2 mg/dL (ref 8.9–10.3)
Chloride: 108 mmol/L (ref 98–111)
Creatinine: 0.85 mg/dL (ref 0.44–1.00)
GFR, Estimated: >60 mL/min (ref >60)
Glucose, Bld: 80 mg/dL (ref 70–99)
Potassium: 4.3 mmol/L (ref 3.5–5.1)
Sodium: 138 mmol/L (ref 135–145)
Total Bilirubin: 0.2 mg/dL (ref <1.2)
Total Protein: 7.9 g/dL (ref 6.5–8.1)

## 2023-04-11 LAB — CBC WITH DIFFERENTIAL (CANCER CENTER ONLY)
Abs Immature Granulocytes: 0.01 10*3/uL (ref 0.00–0.07)
Basophils Absolute: 0 10*3/uL (ref 0.0–0.1)
Basophils Relative: 0 %
Eosinophils Absolute: 0 10*3/uL (ref 0.0–0.5)
Eosinophils Relative: 1 %
HCT: 41.1 % (ref 36.0–46.0)
Hemoglobin: 13.7 g/dL (ref 12.0–15.0)
Immature Granulocytes: 0 %
Lymphocytes Relative: 28 %
Lymphs Abs: 1.6 10*3/uL (ref 0.7–4.0)
MCH: 30.8 pg (ref 26.0–34.0)
MCHC: 33.3 g/dL (ref 30.0–36.0)
MCV: 92.4 fL (ref 80.0–100.0)
Monocytes Absolute: 0.5 10*3/uL (ref 0.1–1.0)
Monocytes Relative: 9 %
Neutro Abs: 3.4 10*3/uL (ref 1.7–7.7)
Neutrophils Relative %: 62 %
Platelet Count: 296 10*3/uL (ref 150–400)
RBC: 4.45 MIL/uL (ref 3.87–5.11)
RDW: 13.1 % (ref 11.5–15.5)
WBC Count: 5.5 10*3/uL (ref 4.0–10.5)
nRBC: 0 % (ref 0.0–0.2)

## 2023-04-11 LAB — ANTITHROMBIN III: AntiThromb III Func: 111 % (ref 75–120)

## 2023-04-11 NOTE — Progress Notes (Signed)
HEMATOLOGY/ONCOLOGY CONSULTATION NOTE  Date of Service: 04/11/2023  Patient Care Team: Caffie Damme, MD as PCP - General (Family Medicine)  CHIEF COMPLAINTS/PURPOSE OF CONSULTATION:   family hx of protein S deficiency.and recent Superficial venous sinus thrombosis.Marland Kitchen   HISTORY OF PRESENTING ILLNESS:   Felicia Castro is a wonderful 27 y.o. female who has been referred to [place] by Lars Mage, PA-C for evaluation for family hx of protein S deficiency.and recent Superficial venous sinus thrombosis..   Venous US of left lower extremity on 01/08/2023 showed that patient was found to have Superficial thrombus within the LEFT greater saphenous vein. There was no evidence of DVT in left lower extremity.   Patient reports that her father has a history of protein S deficiency. Her father did have two blood clots, one of which after stopping Eliquis. Her father's blood clot began in the legs, then traveled to the lungs. Her father did not have any issues with blood clots as a child.   She reports that her paternal grandfather also had a blood clot, though she is unsure of type. She denies any known specific blood disorder in her grandfather.   Patient has not been tested for protein S deficiency in the past. Pt has never had a blood clot prior to her most recent event.  Patient reports that there is no concern for venous reflux by her vascular surgeon.   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 Patient notes that she is quite susceptible to tripping and acquiring injuries while working. Patient has had a couple injuries to the left leg. Patient reports that she typically endorses injuries in the front of her lower extremities, but sometimes has trauma to the inner side. She reports that she previously injured her left knee in 2015.   Patient reports that she previously woke up with leg cramps, which would typically resolve after three days. However, during her  blood clotting event, she noticed worsening pain limiting walking ability despite Ibuprofen use. She reports that her leg pain and swelling has resolved at this time.  She denies any recent infections, long distance travel, reason for immobility, or use of hormonal birth control. Patient has never used hormonal birth control in the past.   She reports that she typically smokes marijuana once a day and smokes one Black & Mild cigar a day. She denies any heavy cigarette smoking.  Patient denies any sudden weight loss, fever, chills, night sweats, or change in bowel habits. Patient reports endorsing a small boil near the groin which typically resolves on its own and is not significantly bothersome.   She reports that her paternal grandfather may have sickle cell disease. Patient reports that two of her grandfathers did have a history of lung cancer. She notes that one was a smoker and the other did have significant second-hand exposure in the workplace. Patient denies any other blood disorders in the family.   MEDICAL HISTORY:  Past Medical History:  Diagnosis Date   Allergy    Asthma    Eczema     SURGICAL HISTORY: Past Surgical History:  Procedure Laterality Date   HERNIA REPAIR      SOCIAL HISTORY: Social History   Socioeconomic History   Marital status: Single    Spouse name: Not on file   Number of children: Not on file   Years of education: Not on file   Highest education level: Not on file  Occupational History  Not on file  Tobacco Use   Smoking status: Every Day    Types: Cigarettes   Smokeless tobacco: Never  Vaping Use   Vaping status: Some Days  Substance and Sexual Activity   Alcohol use: Yes    Comment: social   Drug use: Yes    Types: Marijuana   Sexual activity: Never    Birth control/protection: None  Other Topics Concern   Not on file  Social History Narrative   Not on file   Social Determinants of Health   Financial Resource Strain: Not on file   Food Insecurity: Not on file  Transportation Needs: Not on file  Physical Activity: Not on file  Stress: Not on file  Social Connections: Not on file  Intimate Partner Violence: Not on file    FAMILY HISTORY: Family History  Problem Relation Age of Onset   Hyperlipidemia Mother    Hypertension Mother    Sudden death Neg Hx    Heart attack Neg Hx    Diabetes Neg Hx     ALLERGIES:  is allergic to apple juice, carrot [daucus carota], egg-derived products, and pollen extract.  MEDICATIONS:  Current Outpatient Medications  Medication Sig Dispense Refill   albuterol (VENTOLIN HFA) 108 (90 Base) MCG/ACT inhaler Inhale 2 puffs into the lungs every 6 (six) hours as needed for wheezing or shortness of breath (or coughing). 1 each 0   APIXABAN (ELIQUIS) VTE STARTER PACK (10MG  AND 5MG ) Take as directed on package: start with two-5mg  tablets twice daily for 7 days. On day 8, switch to one-5mg  tablet twice daily. 74 each 0   benzonatate (TESSALON) 100 MG capsule Take 1 capsule (100 mg total) by mouth every 8 (eight) hours. (Patient not taking: Reported on 03/02/2023) 21 capsule 0   ciprofloxacin (CIPRO) 500 MG tablet Take 1 tablet (500 mg total) by mouth every 12 (twelve) hours. (Patient not taking: Reported on 03/02/2023) 10 tablet 0   EPIPEN 2-PAK 0.3 MG/0.3ML SOAJ injection      HYDROcodone-acetaminophen (NORCO/VICODIN) 5-325 MG tablet Take 1 tablet by mouth every 6 (six) hours as needed for moderate pain (pain score 4-6).     ibuprofen (ADVIL,MOTRIN) 600 MG tablet Take 1 tablet (600 mg total) by mouth every 6 (six) hours as needed. 30 tablet 0   naproxen (NAPROSYN) 500 MG tablet Take 1 tablet (500 mg total) 2 (two) times daily by mouth. 14 tablet 0   naproxen sodium (ANAPROX) 220 MG tablet Take 220 mg by mouth 2 (two) times daily with a meal.     predniSONE (DELTASONE) 50 MG tablet Take 1 tablet (50 mg total) by mouth daily. (Patient not taking: Reported on 03/02/2023) 5 tablet 0   No  current facility-administered medications for this visit.    REVIEW OF SYSTEMS:    10 Point review of Systems was done is negative except as noted above.  PHYSICAL EXAMINATION: ECOG PERFORMANCE STATUS: 1 - Symptomatic but completely ambulatory  . Vitals:   04/11/23 1100  BP: (!) 140/83  Pulse: 99  Resp: 20  Temp: 98.1 F (36.7 C)  SpO2: 99%   Filed Weights   04/11/23 1100  Weight: 222 lb 12.8 oz (101.1 kg)   .Body mass index is 39.47 kg/m.  GENERAL:alert, in no acute distress and comfortable SKIN: no acute rashes, no significant lesions EYES: conjunctiva are pink and non-injected, sclera anicteric OROPHARYNX: MMM, no exudates, no oropharyngeal erythema or ulceration NECK: supple, no JVD LYMPH:  no palpable lymphadenopathy in the  cervical, axillary or inguinal regions LUNGS: clear to auscultation b/l with normal respiratory effort HEART: regular rate & rhythm ABDOMEN:  normoactive bowel sounds , non tender, not distended. Extremity: no pedal edema PSYCH: alert & oriented x 3 with fluent speech NEURO: no focal motor/sensory deficits  LABORATORY DATA:  I have reviewed the data as listed  .    Latest Ref Rng & Units 04/11/2023   12:21 PM 01/08/2023    9:09 PM  CBC  WBC 4.0 - 10.5 K/uL 5.5  6.1   Hemoglobin 12.0 - 15.0 g/dL 09.8  11.9   Hematocrit 36.0 - 46.0 % 41.1  37.7   Platelets 150 - 400 K/uL 296  283     .    Latest Ref Rng & Units 04/11/2023   12:21 PM 01/08/2023    9:09 PM  CMP  Glucose 70 - 99 mg/dL 80  90   BUN 6 - 20 mg/dL 11  10   Creatinine 1.47 - 1.00 mg/dL 8.29  5.62   Sodium 130 - 145 mmol/L 138  134   Potassium 3.5 - 5.1 mmol/L 4.3  3.9   Chloride 98 - 111 mmol/L 108  104   CO2 22 - 32 mmol/L 25  23   Calcium 8.9 - 10.3 mg/dL 9.2  8.5   Total Protein 6.5 - 8.1 g/dL 7.9    Total Bilirubin <1.2 mg/dL 0.2    Alkaline Phos 38 - 126 U/L 37    AST 15 - 41 U/L 15    ALT 0 - 44 U/L 16       RADIOGRAPHIC STUDIES: I have personally reviewed  the radiological images as listed and agreed with the findings in the report. DG Chest Port 1 View  Result Date: 04/01/2023 CLINICAL DATA:  Shortness of breath EXAM: PORTABLE CHEST 1 VIEW COMPARISON:  Chest x-ray 08/14/2020 FINDINGS: The heart size and mediastinal contours are within normal limits. Both lungs are clear. The visualized skeletal structures are unremarkable. IMPRESSION: No active disease. Electronically Signed   By: Darliss Cheney M.D.   On: 04/01/2023 00:53    ASSESSMENT & PLAN:   27 y.o. female with:  Evaluation for family hx of Protein S deficiency in her father  H/o Superficial venous thrombosis in the left greater saphenous vein.? Triggered by leg injury, smoking, varicose veins etc. r/o hypercoagulable state.  PLAN:  -venous US on 01/08/2023 showed: 1. No evidence of deep venous thrombosis within the LEFT lower extremity. 2. Superficial thrombus within the LEFT greater saphenous vein -educated patient that Protein S is one of the naturally occurring blood thinners in the body -educated patient on details of congential vs acquired protein S deficiency -educated patient that superficial blood clots may be caused by factors such as injury, inflammation, or varicose veins -discussed that there may be a role for blood thinners for three months if there are no other risk factors found, though she may need to be on them for longer depending on findings -recommend patient to use knee-high graded sports compression socks to keep the veins compressed and reduce the risk of repeat clots lower locally -advised patient to avoid sitting for extensive periods, stay well hydrated, and avoid cigarette smoking -educated patient on Hidradenitis suppurativa, which can cause recurrent boils in the groin and armpit and can cause a fair amount of inflammation, though it would generally not cause significant abscess -educated patient that skin abscesses in the area of veins may trigger  inflammation or blood clot -  recommend patient to keep the groin area dry and wear loose clothing to address boils. Discussed options of topical antibiotics to improve symptoms.  -will order blood tests today including hypercoag w/u given fhx -answered all of patient's questions in detail  . Orders Placed This Encounter  Procedures   CBC with Differential (Cancer Center Only)    Standing Status:   Future    Number of Occurrences:   1    Expected Date:   04/11/2023    Expiration Date:   04/10/2024   Hgb Fractionation Cascade    Standing Status:   Future    Number of Occurrences:   1    Expected Date:   04/11/2023    Expiration Date:   04/10/2024   Antithrombin III    Standing Status:   Future    Number of Occurrences:   1    Expected Date:   04/11/2023    Expiration Date:   04/10/2024   Protein C activity    Standing Status:   Future    Number of Occurrences:   1    Expected Date:   04/11/2023    Expiration Date:   04/10/2024   Protein C, total    Standing Status:   Future    Number of Occurrences:   1    Expected Date:   04/11/2023    Expiration Date:   04/10/2024   Protein S activity    Standing Status:   Future    Number of Occurrences:   1    Expected Date:   04/11/2023    Expiration Date:   04/10/2024   Protein S, total    Standing Status:   Future    Number of Occurrences:   1    Expected Date:   04/11/2023    Expiration Date:   04/10/2024   Lupus anticoagulant panel    Standing Status:   Future    Number of Occurrences:   1    Expected Date:   04/11/2023    Expiration Date:   04/10/2024   Beta-2-glycoprotein i abs, IgG/M/A    Standing Status:   Future    Number of Occurrences:   1    Expected Date:   04/11/2023    Expiration Date:   04/10/2024   Homocysteine, serum    Standing Status:   Future    Number of Occurrences:   1    Expected Date:   04/11/2023    Expiration Date:   04/10/2024   Factor 5 leiden    Standing Status:   Future    Number of Occurrences:   1     Expected Date:   04/11/2023    Expiration Date:   04/10/2024   Prothrombin gene mutation    Standing Status:   Future    Number of Occurrences:   1    Expected Date:   04/11/2023    Expiration Date:   04/10/2024   Cardiolipin antibodies, IgG, IgM, IgA    Standing Status:   Future    Number of Occurrences:   1    Expected Date:   04/11/2023    Expiration Date:   04/10/2024   CMP (Cancer Center only)    Standing Status:   Future    Number of Occurrences:   1    Expected Date:   04/11/2023    Expiration Date:   04/10/2024    FOLLOW-UP: Labs today Phone visit with Dr Candise Che in 2  weeks  The total time spent in the appointment was 50 minutes* .  All of the patient's questions were answered with apparent satisfaction. The patient knows to call the clinic with any problems, questions or concerns.   Wyvonnia Lora MD MS AAHIVMS [facility] Hematology/Oncology Physician [facility]  .*Total Encounter Time as defined by the Centers for Medicare and Medicaid Services includes, in addition to the face-to-face time of a patient visit (documented in the note above) non-face-to-face time: obtaining and reviewing outside history, ordering and reviewing medications, tests or procedures, care coordination (communications with other health care professionals or caregivers) and documentation in the medical record.    I,Mitra Faeizi,acting as a Neurosurgeon for Wyvonnia Lora, MD.,have documented all relevant documentation on the behalf of Wyvonnia Lora, MD,as directed by  Wyvonnia Lora, MD while in the presence of Wyvonnia Lora, MD.  .I have reviewed the above documentation for accuracy and completeness, and I agree with the above. Johney Maine MD

## 2023-04-13 LAB — CARDIOLIPIN ANTIBODIES, IGG, IGM, IGA
Anticardiolipin IgA: <9 [APL'U]/mL (ref 0–11)
Anticardiolipin IgG: <9 [GPL'U]/mL (ref 0–14)
Anticardiolipin IgM: <9 [MPL'U]/mL (ref 0–12)

## 2023-04-13 LAB — BETA-2-GLYCOPROTEIN I ABS, IGG/M/A
Beta-2 Glyco I IgG: <9 GPI IgG units (ref 0–20)
Beta-2-Glycoprotein I IgA: <9 GPI IgA units (ref 0–25)
Beta-2-Glycoprotein I IgM: <9 GPI IgM units (ref 0–32)

## 2023-04-13 LAB — PROTEIN C ACTIVITY: Protein C Activity: 92 % (ref 73–180)

## 2023-04-13 LAB — PROTEIN S, TOTAL: Protein S Ag, Total: 66 % (ref 60–150)

## 2023-04-13 LAB — PROTEIN S ACTIVITY: Protein S Activity: 39 % — ABNORMAL LOW (ref 63–140)

## 2023-04-14 LAB — LUPUS ANTICOAGULANT PANEL
DRVVT: 43.7 s (ref 0.0–47.0)
PTT Lupus Anticoagulant: 25.9 s (ref 0.0–43.5)

## 2023-04-14 LAB — HGB FRACTIONATION CASCADE
Hgb A2: 2.5 % (ref 1.8–3.2)
Hgb A: 97.5 % (ref 96.4–98.8)
Hgb F: 0 % (ref 0.0–2.0)
Hgb S: 0 %

## 2023-04-14 LAB — HOMOCYSTEINE: Homocysteine: 9.8 umol/L (ref 0.0–14.5)

## 2023-04-15 LAB — PROTEIN C, TOTAL: Protein C, Total: 86 % (ref 60–150)

## 2023-04-17 LAB — FACTOR 5 LEIDEN

## 2023-04-17 LAB — PROTHROMBIN GENE MUTATION

## 2023-05-13 ENCOUNTER — Inpatient Hospital Stay: Payer: BC Managed Care – PPO | Attending: Hematology | Admitting: Hematology

## 2023-05-13 DIAGNOSIS — D6859 Other primary thrombophilia: Secondary | ICD-10-CM | POA: Insufficient documentation

## 2023-05-13 DIAGNOSIS — Z8249 Family history of ischemic heart disease and other diseases of the circulatory system: Secondary | ICD-10-CM | POA: Diagnosis not present

## 2023-05-13 DIAGNOSIS — I82812 Embolism and thrombosis of superficial veins of left lower extremities: Secondary | ICD-10-CM

## 2023-05-13 DIAGNOSIS — F17210 Nicotine dependence, cigarettes, uncomplicated: Secondary | ICD-10-CM | POA: Diagnosis not present

## 2023-05-13 NOTE — Progress Notes (Signed)
 HEMATOLOGY/ONCOLOGY PHONE VISIT NOTE  Date of Service: 05/13/2023  Patient Care Team: Felicia Round, MD as PCP - General (Family Medicine)  CHIEF COMPLAINTS/PURPOSE OF CONSULTATION:   family hx of protein S deficiency.and recent Superficial venous sinus thrombosis.SABRA   HISTORY OF PRESENTING ILLNESS:   Felicia Castro is a wonderful 28 y.o. female who has been referred to us  by Gerome Maurilio HERO, PA-C for evaluation for family hx of protein S deficiency.and recent Superficial venous sinus thrombosis..   Venous US  of left lower extremity on 01/08/2023 showed that patient was found to have Superficial thrombus within the LEFT greater saphenous vein. There was no evidence of DVT in left lower extremity.   Patient reports that her father has a history of protein S deficiency. Her father did have two blood clots, one of which after stopping Eliquis . Her father's blood clot began in the legs, then traveled to the lungs. Her father did not have any issues with blood clots as a child.   She reports that her paternal grandfather also had a blood clot, though she is unsure of type. She denies any known specific blood disorder in her grandfather.   Patient has not been tested for protein S deficiency in the past. Pt has never had a blood clot prior to her most recent event.  Patient reports that there is no concern for venous reflux by her vascular surgeon.   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 Patient notes that she is quite susceptible to tripping and acquiring injuries while working. Patient has had a couple injuries to the left leg. Patient reports that she typically endorses injuries in the front of her lower extremities, but sometimes has trauma to the inner side. She reports that she previously injured her left knee in 2015.   Patient reports that she previously woke up with leg cramps, which would typically resolve after three days. However, during her  blood clotting event, she noticed worsening pain limiting walking ability despite Ibuprofen  use. She reports that her leg pain and swelling has resolved at this time.  She denies any recent infections, long distance travel, reason for immobility, or use of hormonal birth control. Patient has never used hormonal birth control in the past.   She reports that she typically smokes marijuana once a day and smokes one Black & Mild cigar a day. She denies any heavy cigarette smoking.  Patient denies any sudden weight loss, fever, chills, night sweats, or change in bowel habits. Patient reports endorsing a small boil near the groin which typically resolves on its own and is not significantly bothersome.   She reports that her paternal grandfather may have sickle cell disease. Patient reports that two of her grandfathers did have a history of lung cancer. She notes that one was a smoker and the other did have significant second-hand exposure in the workplace. Patient denies any other blood disorders in the family.   INTERVAL HISTORY:  Felicia Castro is a 28 y.o. female being connected with via telemedicine visit for continued evaluation and management of acute superficial venous thrombosis of left lower extremity.  I connected with Thersia Mace on 05/13/23 at  3:30 PM EST by telephone visit and verified that I am speaking with the correct person using two identifiers.   I discussed the limitations, risks, security and privacy concerns of performing an evaluation and management service by telemedicine and the availability of in-person appointments. I also discussed with  the patient that there may be a patient responsible charge related to this service. The patient expressed understanding and agreed to proceed.   Other persons participating in the visit and their role in the encounter: none   Patient's location: home  Provider's location: [facility]   Chief Complaint: continued evaluation and management of  acute superficial venous thrombosis of left lower extremity    Today, she reports that she has been doing well overall. Patient reports that her left lower extremity symptoms have improved and her leg swelling is mild at this time. She denies any pain, redness, or discomfort in the area.   Patient has been tolerating Eliquis  well with no toxicity issues. She notes that she has been on blood thinners for about 4 months.   She reports that she has been using compression socks which has improved her leg swelling. She reports that she did see a vascular surgeon previously.  The results of her recent lab workup was discussed with her in detail.  MEDICAL HISTORY:  Past Medical History:  Diagnosis Date   Allergy    Asthma    Eczema     SURGICAL HISTORY: Past Surgical History:  Procedure Laterality Date   HERNIA REPAIR      SOCIAL HISTORY: Social History   Socioeconomic History   Marital status: Single    Spouse name: Not on file   Number of children: Not on file   Years of education: Not on file   Highest education level: Not on file  Occupational History   Not on file  Tobacco Use   Smoking status: Every Day    Types: Cigarettes   Smokeless tobacco: Never  Vaping Use   Vaping status: Some Days  Substance and Sexual Activity   Alcohol use: Yes    Comment: social   Drug use: Yes    Types: Marijuana   Sexual activity: Never    Birth control/protection: None  Other Topics Concern   Not on file  Social History Narrative   Not on file   Social Drivers of Health   Financial Resource Strain: Not on file  Food Insecurity: Not on file  Transportation Needs: Not on file  Physical Activity: Not on file  Stress: Not on file  Social Connections: Not on file  Intimate Partner Violence: Not on file    FAMILY HISTORY: Family History  Problem Relation Age of Onset   Hyperlipidemia Mother    Hypertension Mother    Sudden death Neg Hx    Heart attack Neg Hx    Diabetes  Neg Hx     ALLERGIES:  is allergic to apple juice, carrot [daucus carota], egg-derived products, and pollen extract.  MEDICATIONS:  Current Outpatient Medications  Medication Sig Dispense Refill   albuterol  (VENTOLIN  HFA) 108 (90 Base) MCG/ACT inhaler Inhale 2 puffs into the lungs every 6 (six) hours as needed for wheezing or shortness of breath (or coughing). 1 each 0   APIXABAN  (ELIQUIS ) VTE STARTER PACK (10MG  AND 5MG ) Take as directed on package: start with two-5mg  tablets twice daily for 7 days. On day 8, switch to one-5mg  tablet twice daily. 74 each 0   benzonatate  (TESSALON ) 100 MG capsule Take 1 capsule (100 mg total) by mouth every 8 (eight) hours. 21 capsule 0   ciprofloxacin  (CIPRO ) 500 MG tablet Take 1 tablet (500 mg total) by mouth every 12 (twelve) hours. 10 tablet 0   EPIPEN 2-PAK 0.3 MG/0.3ML SOAJ injection      HYDROcodone -acetaminophen  (  NORCO/VICODIN) 5-325 MG tablet Take 1 tablet by mouth every 6 (six) hours as needed for moderate pain (pain score 4-6). (Patient not taking: Reported on 04/11/2023)     hydrOXYzine (ATARAX) 25 MG tablet Take 25 mg by mouth 2 (two) times daily as needed.     ibuprofen  (ADVIL ,MOTRIN ) 600 MG tablet Take 1 tablet (600 mg total) by mouth every 6 (six) hours as needed. 30 tablet 0   naproxen  (NAPROSYN ) 500 MG tablet Take 1 tablet (500 mg total) 2 (two) times daily by mouth. (Patient not taking: Reported on 04/11/2023) 14 tablet 0   naproxen  sodium (ANAPROX ) 220 MG tablet Take 220 mg by mouth 2 (two) times daily with a meal. (Patient not taking: Reported on 04/11/2023)     predniSONE  (DELTASONE ) 50 MG tablet Take 1 tablet (50 mg total) by mouth daily. (Patient not taking: Reported on 03/02/2023) 5 tablet 0   No current facility-administered medications for this visit.    REVIEW OF SYSTEMS:    10 Point review of Systems was done is negative except as noted above.   PHYSICAL EXAMINATION: TELEMEDICINE VISIT  LABORATORY DATA:  I have reviewed the data  as listed  .    Latest Ref Rng & Units 04/11/2023   12:21 PM 01/08/2023    9:09 PM  CBC  WBC 4.0 - 10.5 K/uL 5.5  6.1   Hemoglobin 12.0 - 15.0 g/dL 86.2  87.3   Hematocrit 36.0 - 46.0 % 41.1  37.7   Platelets 150 - 400 K/uL 296  283     .    Latest Ref Rng & Units 04/11/2023   12:21 PM 01/08/2023    9:09 PM  CMP  Glucose 70 - 99 mg/dL 80  90   BUN 6 - 20 mg/dL 11  10   Creatinine 9.55 - 1.00 mg/dL 9.14  9.21   Sodium 864 - 145 mmol/L 138  134   Potassium 3.5 - 5.1 mmol/L 4.3  3.9   Chloride 98 - 111 mmol/L 108  104   CO2 22 - 32 mmol/L 25  23   Calcium 8.9 - 10.3 mg/dL 9.2  8.5   Total Protein 6.5 - 8.1 g/dL 7.9    Total Bilirubin <1.2 mg/dL 0.2    Alkaline Phos 38 - 126 U/L 37    AST 15 - 41 U/L 15    ALT 0 - 44 U/L 16       RADIOGRAPHIC STUDIES: I have personally reviewed the radiological images as listed and agreed with the findings in the report. No results found.  ASSESSMENT & PLAN:   28 y.o. female with:  Evaluation for family hx of Protein S deficiency in her father  H/o Superficial venous thrombosis in the left greater saphenous vein.? Triggered by leg injury, smoking, varicose veins etc. r/o hypercoagulable state.  PLAN:  -Discussed lab results from 04/11/2023 in detail with patient. CBC normal, showed WBC of 5.5K, hemoglobin of 13.7, and platelets of 296K. -CMP showed no sign of liver problems -Prothrombin gene mutation and factor 5 leiden testing negative -Acquired antibodies testing negative -Protein C and antithrombin III  testing normal -Total protein S is normal, but the functional level is slightly low at 39. Discussed that given her borderline low protein S activity level, it is  possible that she has a protein S deficiency. Discussed that there are several factors that can affect protein S level, including menstrual cycles, blood thinners, acute blood clots, and vitamin K levels. Educated patient  that chronic inflammation can also cause protein S  levels to be slightly low.  -recommend re-evaluating with a Protein S panel in 1-2 months to be completely sure of findings as it may have a bearing on long-term recommendations.  -discussed that if she is confirmed to have protein S deficiency in 1-2 months, then this may be an ongoing risk factor. In such case, it would be recommended to avoid any hormonal birth control pills, and being on a preventative dose of blood thinner during pregnancy and post partum. Would also recommend other family members to be tested for Protein S deficiency as well.  -patient has no acute symptoms from superficial venous thrombosis at this time -Given that patient has no hx of deep clot, if findings on repeat protein S testing show that her levels are low, there may be a role to switch to baby aspirin for preventative purposes.  -she shall switch to baby aspirin 81 mg daily after completing her remaining Eliquis  tablets.  -Ultrasound technologist did note findings of reflux and varicose veins on the left side, which would be local risk factors for superficial venous clots. This finding can cause chronic leg swelling and ulcers over time.  -will place referral to vascular surgeon to evaluate venous reflux and varicose veins further and address the leaky valve causing reflux to decrease the risk of repeat superficial blood clots -recommend patient to continue to use compression socks regularly -recommend consuming green leafy vegetables as an optimal source of vitamin K -continue to stay well hydrated -answered all of patient's questions in detail  FOLLOW-UP: -Referral to Dr. Sheree vascular surgery for left leg swelling with ultrasound evidence of deep venous reflux and varicose veins with superficial venous thrombosis. -Repeat labs for protein S in 4 weeks -phone visit with Dr. Onesimo in 5 weeks  The total time spent in the appointment was 30 minutes* .  All of the patient's questions were answered with apparent  satisfaction. The patient knows to call the clinic with any problems, questions or concerns.   Felicia Onesimo MD MS AAHIVMS [facility] Hematology/Oncology Physician [facility]  .*Total Encounter Time as defined by the Centers for Medicare and Medicaid Services includes, in addition to the face-to-face time of a patient visit (documented in the note above) non-face-to-face time: obtaining and reviewing outside history, ordering and reviewing medications, tests or procedures, care coordination (communications with other health care professionals or caregivers) and documentation in the medical record.    I,Mitra Faeizi,acting as a neurosurgeon for Felicia Onesimo, MD.,have documented all relevant documentation on the behalf of Felicia Onesimo, MD,as directed by  Felicia Onesimo, MD while in the presence of Felicia Onesimo, MD.  .I have reviewed the above documentation for accuracy and completeness, and I agree with the above. .Takoda Janowiak Kishore Umaima Scholten MD

## 2023-05-14 ENCOUNTER — Telehealth: Payer: Self-pay | Admitting: Hematology

## 2023-05-14 NOTE — Telephone Encounter (Signed)
 Left patient a vm regarding upcoming appointment

## 2023-05-20 ENCOUNTER — Other Ambulatory Visit: Payer: Self-pay | Admitting: *Deleted

## 2023-05-20 ENCOUNTER — Ambulatory Visit (INDEPENDENT_AMBULATORY_CARE_PROVIDER_SITE_OTHER): Payer: BC Managed Care – PPO

## 2023-05-20 ENCOUNTER — Other Ambulatory Visit (HOSPITAL_BASED_OUTPATIENT_CLINIC_OR_DEPARTMENT_OTHER): Payer: Self-pay

## 2023-05-20 ENCOUNTER — Ambulatory Visit (HOSPITAL_COMMUNITY)
Admission: RE | Admit: 2023-05-20 | Discharge: 2023-05-20 | Disposition: A | Discharge status: Home / Self Care | Payer: BC Managed Care – PPO | Source: Ambulatory Visit | Attending: Vascular Surgery | Admitting: Vascular Surgery

## 2023-05-20 VITALS — BP 133/85 | HR 96 | Temp 98.5°F | Resp 16 | Ht 63.0 in | Wt 228.4 lb

## 2023-05-20 DIAGNOSIS — I8000 Phlebitis and thrombophlebitis of superficial vessels of unspecified lower extremity: Secondary | ICD-10-CM

## 2023-05-20 DIAGNOSIS — I8002 Phlebitis and thrombophlebitis of superficial vessels of left lower extremity: Secondary | ICD-10-CM | POA: Diagnosis not present

## 2023-05-20 DIAGNOSIS — I872 Venous insufficiency (chronic) (peripheral): Secondary | ICD-10-CM | POA: Diagnosis not present

## 2023-05-20 NOTE — Progress Notes (Signed)
 [facility]           OF [place]  History and Physical   Felicia Castro is a 28 y.o. female who was seen in October 2024 with hx of superficial thrombus in the left GSV and was sent for evaluation with hematology for hypercoagulable work up.  She was referred back for venous reflux study.    She states that her legs do swell and improve with elevation.  She works at [employer] in [place] and does stand for long periods of time.  She does not have any hx of DVT or family hx of this per pt but per hematology note, her father had hx of DVT/PE.  There is family hx of sickle cell disease.  She states her mom had some leg swelling but unsure about varicose veins.  She states that her leg swelling has improved.   She does have heaviness and achiness in her legs after being up on them and this improves with elevation.  The compression socks have also helped her symptoms.    Hematology has done a work up with plans to repeat her Protein S panel in a couple of months and that will determine need for Eliquis  or if she can switch over to baby asa.    The pt is not on a statin for cholesterol management.  The pt is not on a daily aspirin.   Other AC:  Eliquis  The pt is not on medication for hypertension.   The pt is not on medication for diabetes.   Tobacco hx:  current  Pt does not have family hx of AAA.  Past Medical History:  Diagnosis Date   Allergy    Asthma    Eczema    Peripheral vascular disease (HCC)     Past Surgical History:  Procedure Laterality Date   HERNIA REPAIR      Social History   Socioeconomic History   Marital status: Single    Spouse name: Not on file   Number of children: Not on file   Years of education: Not on file   Highest education level: Not on file  Occupational History   Not on file  Tobacco Use   Smoking status: Every Day    Types: Cigarettes   Smokeless tobacco: Never  Vaping Use   Vaping status: Some  Days  Substance and Sexual Activity   Alcohol use: Yes    Comment: social   Drug use: Yes    Types: Marijuana   Sexual activity: Never    Birth control/protection: None  Other Topics Concern   Not on file  Social History Narrative   Not on file   Social Drivers of Health   Financial Resource Strain: Not on file  Food Insecurity: Not on file  Transportation Needs: Not on file  Physical Activity: Not on file  Stress: Not on file  Social Connections: Not on file  Intimate Partner Violence: Not on file     Family History  Problem Relation Age of Onset   Hyperlipidemia Mother    Hypertension Mother    Sudden death Neg Hx    Heart attack Neg Hx    Diabetes Neg Hx     Current Outpatient Medications  Medication Sig Dispense Refill   albuterol  (VENTOLIN  HFA) 108 (90 Base) MCG/ACT inhaler Inhale 2 puffs into the lungs every 6 (six) hours as needed for wheezing or shortness of breath (  or coughing). 1 each 0   APIXABAN  (ELIQUIS ) VTE STARTER PACK (10MG  AND 5MG ) Take as directed on package: start with two-5mg  tablets twice daily for 7 days. On day 8, switch to one-5mg  tablet twice daily. 74 each 0   benzonatate  (TESSALON ) 100 MG capsule Take 1 capsule (100 mg total) by mouth every 8 (eight) hours. 21 capsule 0   ciprofloxacin  (CIPRO ) 500 MG tablet Take 1 tablet (500 mg total) by mouth every 12 (twelve) hours. 10 tablet 0   EPIPEN 2-PAK 0.3 MG/0.3ML SOAJ injection      hydrOXYzine (ATARAX) 25 MG tablet Take 25 mg by mouth 2 (two) times daily as needed.     ibuprofen  (ADVIL ,MOTRIN ) 600 MG tablet Take 1 tablet (600 mg total) by mouth every 6 (six) hours as needed. 30 tablet 0   HYDROcodone -acetaminophen  (NORCO/VICODIN) 5-325 MG tablet Take 1 tablet by mouth every 6 (six) hours as needed for moderate pain (pain score 4-6). (Patient not taking: Reported on 04/11/2023)     naproxen  (NAPROSYN ) 500 MG tablet Take 1 tablet (500 mg total) 2 (two) times daily by mouth. (Patient not taking: Reported  on 05/20/2023) 14 tablet 0   naproxen  sodium (ANAPROX ) 220 MG tablet Take 220 mg by mouth 2 (two) times daily with a meal. (Patient not taking: Reported on 05/20/2023)     predniSONE  (DELTASONE ) 50 MG tablet Take 1 tablet (50 mg total) by mouth daily. (Patient not taking: Reported on 05/20/2023) 5 tablet 0   No current facility-administered medications for this visit.    Allergies  Allergen Reactions   Apple Juice Anaphylaxis   Carrot [Daucus Carota] Anaphylaxis   Egg-Derived Products Anaphylaxis   Pollen Extract     REVIEW OF SYSTEMS:   [X]  denotes positive finding, [ ]  denotes negative finding Cardiac  Comments:  Chest pain or chest pressure:    Shortness of breath upon exertion:    Short of breath when lying flat:    Irregular heart rhythm:        Vascular    Pain in calf, thigh, or hip brought on by ambulation:    Pain in feet at night that wakes you up from your sleep:     Blood clot in your veins:    Leg swelling:  x       Pulmonary    Oxygen  at home:    Productive cough:     Wheezing:         Neurologic    Sudden weakness in arms or legs:     Sudden numbness in arms or legs:     Sudden onset of difficulty speaking or slurred speech:    Temporary loss of vision in one eye:     Problems with dizziness:         Gastrointestinal    Blood in stool:     Vomited blood:         Genitourinary    Burning when urinating:     Blood in urine:        Psychiatric    Major depression:         Hematologic    Bleeding problems:    Problems with blood clotting too easily:        Skin    Rashes or ulcers:        Constitutional    Fever or chills:      PHYSICAL EXAMINATION:  Today's Vitals   05/20/23 1456  BP: 133/85  Pulse: 96  Resp: 16  Temp: 98.5 F (36.9 C)  TempSrc: Temporal  SpO2: 95%  Weight: 228 lb 6.4 oz (103.6 kg)  Height: 5\' 3"  (1.6 m)   Body mass index is 40.46 kg/m.   General:  WDWN in NAD; vital signs documented above Gait: Not  observed HENT: WNL, normocephalic Pulmonary: normal non-labored breathing without wheezing Cardiac: regular HR; without carotid bruits Abdomen: soft, NT, aortic pulse is not palpable Skin: without rashes Vascular Exam/Pulses: 2+ bilateral radial pulses 2+ left DP pulse Extremities: mild LLE swelling  Neurologic: A&O X 3;  moving all extremities equally Psychiatric:  The pt has Normal affect.   Non-Invasive Vascular Imaging:   Venous duplex on 05/20/2023: +--------------+---------+------+-----------+------------+----------------+   LEFT         Reflux NoRefluxReflux TimeDiameter cmsComments                                Yes                                          +-------------+---------+------+-----------+------------+----------------+  CFV                    yes   >1 second                              +-------------+---------+------+-----------+------------+----------------+  FV prox                 yes   >1 second                              +--------------+---------+------+-----------+------------+----------------+  FV mid                  yes   >1 second                              +--------------+---------+------+-----------+------------+----------------+  FV dist                 yes   >1 second                              +--------------+---------+------+-----------+------------+----------------+  Popliteal              yes   >1 second                              +--------------+---------+------+-----------+------------+----------------+  GSV at [facility]    no               >500 ms     0.853                     +--------------+---------+------+-----------+------------+----------------+  GSV prox thigh          yes                0.555                     +--------------+---------+------+-----------+------------+----------------+  GSV mid thigh           yes  0.358                      +--------------+---------+------+-----------+------------+----------------+  GSV dist thigh          yes                0.356                     +--------------+---------+------+-----------+------------+----------------+  GSV at knee             yes                0.274                     +--------------+---------+------+-----------+------------+----------------+  GSV prox calf                                       chronic thrombus  +--------------+---------+------+-----------+------------+----------------+  GSV mid calf                                        chronic thrombus +--------------+---------+------+-----------+------------+----------------+  GSV dist calf                                       chronic thrombus  +--------------+---------+------+-----------+------------+----------------+  SSV Pop Fossa           yes                0.853                     +--------------+---------+------+-----------+------------+----------------+  SSV prox calf           yes                0.384                     +--------------+---------+------+-----------+------------+----------------+  SSV mid calf            yes                0.338                     +--------------+---------+------+-----------+------------+----------------+  AASV         no                                                     +--------------+---------+------+-----------+------------+----------------+   Summary:  Left:  - No evidence of deep vein thrombosis seen in the left lower extremity, from the common femoral through the popliteal veins.  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  - Superficial vein reflux in the SFJ.     Felicia Castro is a 28 y.o. female who was seen in October 2024 with hx of superficial thrombus in the left GSV and was sent for evaluation with hematology for  hypercoagulable work up.  She was referred back for venous reflux study  -pt has easily palpable left  DP pedal pulse -pt does not have evidence of DVT.  Pt does have venous reflux in the left deep and superficial venous system.  She does have chronic thrombus in the GSV on the left.   -discussed with pt about continuing to wear her compression as she does get relief with this.  -discussed the importance of leg elevation and how to elevate properly - pt is advised to elevate their legs and a diagram is given to them to demonstrate for pt to lay flat on their back with knees elevated and slightly bent with their feet higher than their knees, which puts their feet higher than their heart for 15 minutes per day.  If pt cannot lay flat, advised to lay as flat as possible.  -pt is advised to continue as much walking as possible and avoid sitting or standing for long periods of time.  -discussed importance of weight loss and exercise and that water aerobics would also be beneficial.  -handout with recommendations given -pt will f/u as needed-she knows to call if she has any further issues  -hematology to determine if she can switch to baby asa after next protein S blood panel.    -current smoker-discussed importance of smoking cessation and that she is young and she would certainly benefit from quitting as it would put her at risk long term for limb loss, heart attack, stroke and cancers.    Maryanna Smart, [facility] [phone]  Clinic MD:  Vikki Graves

## 2023-06-10 ENCOUNTER — Other Ambulatory Visit (HOSPITAL_BASED_OUTPATIENT_CLINIC_OR_DEPARTMENT_OTHER): Payer: Self-pay

## 2023-06-11 ENCOUNTER — Inpatient Hospital Stay: Payer: BC Managed Care – PPO | Attending: Hematology

## 2023-06-11 DIAGNOSIS — Z7901 Long term (current) use of anticoagulants: Secondary | ICD-10-CM | POA: Diagnosis not present

## 2023-06-11 DIAGNOSIS — D6859 Other primary thrombophilia: Secondary | ICD-10-CM | POA: Insufficient documentation

## 2023-06-11 DIAGNOSIS — Z8249 Family history of ischemic heart disease and other diseases of the circulatory system: Secondary | ICD-10-CM | POA: Insufficient documentation

## 2023-06-11 DIAGNOSIS — F1210 Cannabis abuse, uncomplicated: Secondary | ICD-10-CM | POA: Insufficient documentation

## 2023-06-11 DIAGNOSIS — F17290 Nicotine dependence, other tobacco product, uncomplicated: Secondary | ICD-10-CM | POA: Insufficient documentation

## 2023-06-11 DIAGNOSIS — Z86718 Personal history of other venous thrombosis and embolism: Secondary | ICD-10-CM | POA: Diagnosis not present

## 2023-06-11 DIAGNOSIS — I82812 Embolism and thrombosis of superficial veins of left lower extremities: Secondary | ICD-10-CM | POA: Diagnosis not present

## 2023-06-12 LAB — PROTEIN S PANEL
Protein S Activity: 40 % — ABNORMAL LOW (ref 63–140)
Protein S Ag, Free: 54 % — ABNORMAL LOW (ref 61–136)
Protein S Ag, Total: 61 % (ref 60–150)

## 2023-06-15 DIAGNOSIS — L2084 Intrinsic (allergic) eczema: Secondary | ICD-10-CM | POA: Diagnosis not present

## 2023-06-19 ENCOUNTER — Inpatient Hospital Stay (HOSPITAL_BASED_OUTPATIENT_CLINIC_OR_DEPARTMENT_OTHER): Payer: BC Managed Care – PPO | Admitting: Hematology

## 2023-06-19 DIAGNOSIS — I82812 Embolism and thrombosis of superficial veins of left lower extremities: Secondary | ICD-10-CM | POA: Diagnosis not present

## 2023-06-19 DIAGNOSIS — Z7901 Long term (current) use of anticoagulants: Secondary | ICD-10-CM | POA: Diagnosis not present

## 2023-06-19 DIAGNOSIS — F1210 Cannabis abuse, uncomplicated: Secondary | ICD-10-CM | POA: Diagnosis not present

## 2023-06-19 DIAGNOSIS — Z86718 Personal history of other venous thrombosis and embolism: Secondary | ICD-10-CM | POA: Diagnosis not present

## 2023-06-19 DIAGNOSIS — Z8249 Family history of ischemic heart disease and other diseases of the circulatory system: Secondary | ICD-10-CM

## 2023-06-19 DIAGNOSIS — F17290 Nicotine dependence, other tobacco product, uncomplicated: Secondary | ICD-10-CM | POA: Diagnosis not present

## 2023-06-19 DIAGNOSIS — D6859 Other primary thrombophilia: Secondary | ICD-10-CM | POA: Diagnosis not present

## 2023-06-19 MED ORDER — RIVAROXABAN 10 MG PO TABS: 10.0000 mg | ORAL_TABLET | Freq: Every day | ORAL | 5 refills | Status: AC

## 2023-06-19 NOTE — Progress Notes (Signed)
 HEMATOLOGY/ONCOLOGY PHONE VISIT NOTE  Date of Service: 06/19/2023  Patient Care Team: Felicia Castro as PCP - General (Family Medicine)  CHIEF COMPLAINTS/PURPOSE OF CONSULTATION:   family hx of protein S deficiency.and recent Superficial venous sinus thrombosis.Marland Kitchen   HISTORY OF PRESENTING ILLNESS:   Felicia Castro is a wonderful 28 y.o. female who has been referred to [place] by Felicia Mage, PA-C for evaluation for family hx of protein S deficiency.and recent Superficial venous sinus thrombosis..   Venous US of left lower extremity on 01/08/2023 showed that patient was found to have Superficial thrombus within the LEFT greater saphenous vein. There was no evidence of DVT in left lower extremity.   Patient reports that her father has a history of protein S deficiency. Her father did have two blood clots, one of which after stopping Eliquis. Her father's blood clot began in the legs, then traveled to the lungs. Her father did not have any issues with blood clots as a child.   She reports that her paternal grandfather also had a blood clot, though she is unsure of type. She denies any known specific blood disorder in her grandfather.   Patient has not been tested for protein S deficiency in the past. Pt has never had a blood clot prior to her most recent event.  Patient reports that there is no concern for venous reflux by her vascular surgeon.   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 Patient notes that she is quite susceptible to tripping and acquiring injuries while working. Patient has had a couple injuries to the left leg. Patient reports that she typically endorses injuries in the front of her lower extremities, but sometimes has trauma to the inner side. She reports that she previously injured her left knee in 2015.   Patient reports that she previously woke up with leg cramps, which would typically resolve after three days. However, during her  blood clotting event, she noticed worsening pain limiting walking ability despite Ibuprofen use. She reports that her leg pain and swelling has resolved at this time.  She denies any recent infections, long distance travel, reason for immobility, or use of hormonal birth control. Patient has never used hormonal birth control in the past.   She reports that she typically smokes marijuana once a day and smokes one Black & Mild cigar a day. She denies any heavy cigarette smoking.  Patient denies any sudden weight loss, fever, chills, night sweats, or change in bowel habits. Patient reports endorsing a small boil near the groin which typically resolves on its own and is not significantly bothersome.   She reports that her paternal grandfather may have sickle cell disease. Patient reports that two of her grandfathers did have a history of lung cancer. She notes that one was a smoker and the other did have significant second-hand exposure in the workplace. Patient denies any other blood disorders in the family.   INTERVAL HISTORY:  Felicia Castro is a 28 y.o. female being connected with via telemedicine visit for continued evaluation and management of acute superficial venous thrombosis of left lower extremity.  I had a phone visit with patient on 05/13/2023 and she reported mild leg swelling.  I connected with Felicia Castro on 06/19/23 at  3:30 PM EST by telephone visit and verified that I am speaking with the correct person using two identifiers.   I discussed the limitations, risks, security and privacy concerns of performing an  evaluation and management service by telemedicine and the availability of in-person appointments. I also discussed with the patient that there may be a patient responsible charge related to this service. The patient expressed understanding and agreed to proceed.   Other persons participating in the visit and their role in the encounter: none   Patient's location: home   Provider's location: [facility]   Chief Complaint: continued evaluation and management of acute superficial venous thrombosis of left lower extremity    She was seen by vascular surgery team on 05/20/2023 and they did not feel that there is a role for surgical intervention to address her venous reflux. She was given recommendations to manage her venous reflux, including using compression socks and weight loss.  Today, she reports that she has completely quit smoking at the start of the new year.   The results of her Protein S panel was discussed with her in detail.   MEDICAL HISTORY:  Past Medical History:  Diagnosis Date   Allergy    Asthma    Eczema    Peripheral vascular disease (HCC)     SURGICAL HISTORY: Past Surgical History:  Procedure Laterality Date   HERNIA REPAIR      SOCIAL HISTORY: Social History   Socioeconomic History   Marital status: Single    Spouse name: Not on file   Number of children: Not on file   Years of education: Not on file   Highest education level: Not on file  Occupational History   Not on file  Tobacco Use   Smoking status: Every Day    Types: Cigarettes   Smokeless tobacco: Never  Vaping Use   Vaping status: Some Days  Substance and Sexual Activity   Alcohol use: Yes    Comment: social   Drug use: Yes    Types: Marijuana   Sexual activity: Never    Birth control/protection: None  Other Topics Concern   Not on file  Social History Narrative   Not on file   Social Drivers of Health   Financial Resource Strain: Not on file  Food Insecurity: Not on file  Transportation Needs: Not on file  Physical Activity: Not on file  Stress: Not on file  Social Connections: Not on file  Intimate Partner Violence: Not on file    FAMILY HISTORY: Family History  Problem Relation Age of Onset   Hyperlipidemia Mother    Hypertension Mother    Sudden death Neg Hx    Heart attack Neg Hx    Diabetes Neg Hx     ALLERGIES:  is allergic to  apple juice, carrot [daucus carota], egg-derived products, and pollen extract.  MEDICATIONS:  Current Outpatient Medications  Medication Sig Dispense Refill   albuterol (VENTOLIN HFA) 108 (90 Base) MCG/ACT inhaler Inhale 2 puffs into the lungs every 6 (six) hours as needed for wheezing or shortness of breath (or coughing). 1 each 0   APIXABAN (ELIQUIS) VTE STARTER PACK (10MG  AND 5MG ) Take as directed on package: start with two-5mg  tablets twice daily for 7 days. On day 8, switch to one-5mg  tablet twice daily. 74 each 0   benzonatate (TESSALON) 100 MG capsule Take 1 capsule (100 mg total) by mouth every 8 (eight) hours. 21 capsule 0   ciprofloxacin (CIPRO) 500 MG tablet Take 1 tablet (500 mg total) by mouth every 12 (twelve) hours. 10 tablet 0   EPIPEN 2-PAK 0.3 MG/0.3ML SOAJ injection      HYDROcodone-acetaminophen (NORCO/VICODIN) 5-325 MG tablet Take 1  tablet by mouth every 6 (six) hours as needed for moderate pain (pain score 4-6). (Patient not taking: Reported on 04/11/2023)     hydrOXYzine (ATARAX) 25 MG tablet Take 25 mg by mouth 2 (two) times daily as needed.     ibuprofen (ADVIL,MOTRIN) 600 MG tablet Take 1 tablet (600 mg total) by mouth every 6 (six) hours as needed. 30 tablet 0   naproxen (NAPROSYN) 500 MG tablet Take 1 tablet (500 mg total) 2 (two) times daily by mouth. (Patient not taking: Reported on 05/20/2023) 14 tablet 0   naproxen sodium (ANAPROX) 220 MG tablet Take 220 mg by mouth 2 (two) times daily with a meal. (Patient not taking: Reported on 05/20/2023)     predniSONE (DELTASONE) 50 MG tablet Take 1 tablet (50 mg total) by mouth daily. (Patient not taking: Reported on 05/20/2023) 5 tablet 0   No current facility-administered medications for this visit.    REVIEW OF SYSTEMS:    10 Point review of Systems was done is negative except as noted above.   PHYSICAL EXAMINATION: TELEMEDICINE VISIT  LABORATORY DATA:  I have reviewed the data as listed  .    Latest Ref Rng &  Units 04/11/2023   12:21 PM 01/08/2023    9:09 PM  CBC  WBC 4.0 - 10.5 K/uL 5.5  6.1   Hemoglobin 12.0 - 15.0 g/dL 16.1  09.6   Hematocrit 36.0 - 46.0 % 41.1  37.7   Platelets 150 - 400 K/uL 296  283     .    Latest Ref Rng & Units 04/11/2023   12:21 PM 01/08/2023    9:09 PM  CMP  Glucose 70 - 99 mg/dL 80  90   BUN 6 - 20 mg/dL 11  10   Creatinine 0.45 - 1.00 mg/dL 4.09  8.11   Sodium 914 - 145 mmol/L 138  134   Potassium 3.5 - 5.1 mmol/L 4.3  3.9   Chloride 98 - 111 mmol/L 108  104   CO2 22 - 32 mmol/L 25  23   Calcium 8.9 - 10.3 mg/dL 9.2  8.5   Total Protein 6.5 - 8.1 g/dL 7.9    Total Bilirubin <1.2 mg/dL 0.2    Alkaline Phos 38 - 126 U/L 37    AST 15 - 41 U/L 15    ALT 0 - 44 U/L 16       RADIOGRAPHIC STUDIES: I have personally reviewed the radiological images as listed and agreed with the findings in the report. VAS [place] LOWER EXTREMITY VENOUS REFLUX Result Date: 05/20/2023  Lower Venous Reflux Study Patient Name:  AVIAN GREENAWALT  Date of Exam:   05/20/2023 Medical Rec #: [id number]       Accession #:    [id number] Date of Birth: 11/18/1995       Patient Gender: F Patient Age:   59 years Exam Location:  [facility] Procedure:      VAS [place] LOWER EXTREMITY VENOUS REFLUX Referring Phys: Graceann Congress --------------------------------------------------------------------------------  Indications: Venous insuficiency.  Comparison Study: [facility] venous duplex on 01/08/23: 1. No evidence of deep venous                   thrombosis within the LEFT lower                   extremity.                   2. Superficial thrombus  within the LEFT greater saphenous vein                   from knee to ankle. Performing Technologist: Thereasa Parkin RVT  Examination Guidelines: A complete evaluation includes B-mode imaging, spectral Doppler, color Doppler, and power Doppler as needed of all accessible portions of each vessel. Bilateral testing is considered an integral part of a complete  examination. Limited examinations for reoccurring indications may be performed as noted. The reflux portion of the exam is performed with the patient in reverse Trendelenburg. Significant venous reflux is defined as >500 ms in the superficial venous system, and >1 second in the deep venous system.  +--------------+---------+------+-----------+------------+----------------+ LEFT          Reflux NoRefluxReflux TimeDiameter cmsComments                                 Yes                                          +--------------+---------+------+-----------+------------+----------------+ CFV                     yes   >1 second                              +--------------+---------+------+-----------+------------+----------------+ FV prox                 yes   >1 second                              +--------------+---------+------+-----------+------------+----------------+ FV mid                  yes   >1 second                              +--------------+---------+------+-----------+------------+----------------+ FV dist                 yes   >1 second                              +--------------+---------+------+-----------+------------+----------------+ Popliteal               yes   >1 second                              +--------------+---------+------+-----------+------------+----------------+ GSV at [facility]    no               >500 ms     0.853                     +--------------+---------+------+-----------+------------+----------------+ GSV prox thigh          yes                0.555                     +--------------+---------+------+-----------+------------+----------------+ GSV mid thigh           yes  0.358                     +--------------+---------+------+-----------+------------+----------------+ GSV dist thigh          yes                0.356                      +--------------+---------+------+-----------+------------+----------------+ GSV at knee             yes                0.274                     +--------------+---------+------+-----------+------------+----------------+ GSV prox calf                                       chronic thrombus +--------------+---------+------+-----------+------------+----------------+ GSV mid calf                                        chronic thrombus +--------------+---------+------+-----------+------------+----------------+ GSV dist calf                                       chronic thrombus +--------------+---------+------+-----------+------------+----------------+ SSV Pop Fossa           yes                0.853                     +--------------+---------+------+-----------+------------+----------------+ SSV prox calf           yes                0.384                     +--------------+---------+------+-----------+------------+----------------+ SSV mid calf            yes                0.338                     +--------------+---------+------+-----------+------------+----------------+ AASV          no                                                     +--------------+---------+------+-----------+------------+----------------+   Summary: Left: - No evidence of deep vein thrombosis seen in the left lower extremity, from the common femoral through the popliteal veins. 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 - Superficial vein reflux in the SFJ.  *See table(s) above for measurements and observations. Electronically signed by Lemar Livings Castro on 05/20/2023 at 5:33:24 PM.    Final     ASSESSMENT & PLAN:   28 y.o. female with:  Evaluation for family hx of Protein S deficiency in her father  H/o Superficial venous thrombosis in the left greater saphenous vein.? Triggered by leg injury,  smoking, varicose veins  etc. r/o hypercoagulable state.  PLAN:  -educated patient on the process of venous reflux -Venus reflux is minor risk factor for repeat clots -protein S panel form 06/11/2023 shows that her free levels and protein S activity levels remain borderline low, which could be a softer risk factor for blood clots in addition to her venous reflux.  -patient has never had a deeper vein blood clot -discussed that her main risk factors would include venous reflux, mild protein S deficiency, and hx of smoking -will start prophylactic dose of Xarelto once daily to provide more protection than baby aspirin alone -discussed option of baby aspirin for anticoagulation, though it may not provide as much protection.  -patient is agreeable to starting Xarelto -discussed that there will be a need for more input in regards to managing blood thinners during pregnancy and Postpartum. Discussed that if she decides to have children, there would be a need for a preventative dose of blood thinners during pregnancy and for 6 weeks following her pregnancy. There will be a need to switch Xarelto to an injectable preventative dose during pregnancy, then switch back to tablets after pregnancy.  -educated patient that smoking can increase the risk of venous blood clots and arterial disease -very proud of patient for smoking cessation -recommend patient to continue to stay regularly active and optimizing weight  -recommend using compression socks regularly to reduce the amount of blood pooling -continue to follow with PCP for long term monitoring and management. She shall return to our clinic as needed  FOLLOW-UP: RTC with PCP  The total time spent in the appointment was 21 minutes* .  All of the patient's questions were answered with apparent satisfaction. The patient knows to call the clinic with any problems, questions or concerns.   Wyvonnia Lora MD MS AAHIVMS [facility] Hematology/Oncology  Physician [facility]  .*Total Encounter Time as defined by the Centers for Medicare and Medicaid Services includes, in addition to the face-to-face time of a patient visit (documented in the note above) non-face-to-face time: obtaining and reviewing outside history, ordering and reviewing medications, tests or procedures, care coordination (communications with other health care professionals or caregivers) and documentation in the medical record.    I,Mitra Faeizi,acting as a Neurosurgeon for Wyvonnia Lora, Castro.,have documented all relevant documentation on the behalf of Wyvonnia Lora, Castro,as directed by  Wyvonnia Lora, Castro while in the presence of Wyvonnia Lora, Castro.  .I have reviewed the above documentation for accuracy and completeness, and I agree with the above. Johney Maine Castro

## 2023-10-24 DIAGNOSIS — L2084 Intrinsic (allergic) eczema: Secondary | ICD-10-CM | POA: Diagnosis not present

## 2023-10-24 DIAGNOSIS — L732 Hidradenitis suppurativa: Secondary | ICD-10-CM | POA: Diagnosis not present

## 2024-01-30 DIAGNOSIS — L732 Hidradenitis suppurativa: Secondary | ICD-10-CM | POA: Diagnosis not present

## 2024-01-30 DIAGNOSIS — L219 Seborrheic dermatitis, unspecified: Secondary | ICD-10-CM | POA: Diagnosis not present

## 2024-01-30 DIAGNOSIS — L2084 Intrinsic (allergic) eczema: Secondary | ICD-10-CM | POA: Diagnosis not present
